# Patient Record
Sex: Male | Born: 1980 | Race: Black or African American | Hispanic: No | Marital: Single | Smoking: Current some day smoker
Health system: Southern US, Community
[De-identification: ages and names within clinical notes are randomized; demographics above are authoritative.]

## PROBLEM LIST (undated history)

## (undated) DIAGNOSIS — J45909 Unspecified asthma, uncomplicated: Secondary | ICD-10-CM

## (undated) DIAGNOSIS — F209 Schizophrenia, unspecified: Secondary | ICD-10-CM

## (undated) HISTORY — PX: NO PAST SURGERIES: SHX2092

---

## 2002-03-15 ENCOUNTER — Emergency Department (HOSPITAL_COMMUNITY): Admission: EM | Admit: 2002-03-15 | Discharge: 2002-03-15 | Payer: Self-pay | Admitting: Emergency Medicine

## 2002-03-15 ENCOUNTER — Encounter: Payer: Self-pay | Admitting: Emergency Medicine

## 2002-04-16 ENCOUNTER — Emergency Department (HOSPITAL_COMMUNITY): Admission: EM | Admit: 2002-04-16 | Discharge: 2002-04-16 | Payer: Self-pay | Admitting: Emergency Medicine

## 2006-04-16 ENCOUNTER — Emergency Department (HOSPITAL_COMMUNITY): Admission: EM | Admit: 2006-04-16 | Discharge: 2006-04-16 | Payer: Self-pay | Admitting: Family Medicine

## 2006-04-20 ENCOUNTER — Emergency Department (HOSPITAL_COMMUNITY): Admission: EM | Admit: 2006-04-20 | Discharge: 2006-04-20 | Payer: Self-pay | Admitting: Emergency Medicine

## 2006-04-27 ENCOUNTER — Emergency Department (HOSPITAL_COMMUNITY): Admission: EM | Admit: 2006-04-27 | Discharge: 2006-04-27 | Payer: Self-pay | Admitting: Emergency Medicine

## 2009-06-09 ENCOUNTER — Emergency Department (HOSPITAL_COMMUNITY): Admission: EM | Admit: 2009-06-09 | Discharge: 2009-06-09 | Payer: Self-pay | Admitting: Family Medicine

## 2010-01-28 ENCOUNTER — Emergency Department (HOSPITAL_COMMUNITY): Admission: EM | Admit: 2010-01-28 | Discharge: 2010-01-28 | Payer: Self-pay | Admitting: Family Medicine

## 2010-10-30 ENCOUNTER — Emergency Department (HOSPITAL_COMMUNITY): Admission: EM | Admit: 2010-10-30 | Discharge: 2010-10-30 | Payer: Self-pay | Admitting: Emergency Medicine

## 2012-01-20 ENCOUNTER — Encounter (HOSPITAL_COMMUNITY): Payer: Self-pay | Admitting: *Deleted

## 2012-01-20 ENCOUNTER — Emergency Department (HOSPITAL_COMMUNITY)
Admission: EM | Admit: 2012-01-20 | Discharge: 2012-01-20 | Disposition: A | Discharge status: Home / Self Care | Payer: Self-pay | Source: Home / Self Care | Attending: Emergency Medicine | Admitting: Emergency Medicine

## 2012-01-20 DIAGNOSIS — L0591 Pilonidal cyst without abscess: Secondary | ICD-10-CM

## 2012-01-20 DIAGNOSIS — L0889 Other specified local infections of the skin and subcutaneous tissue: Secondary | ICD-10-CM

## 2012-01-20 DIAGNOSIS — K130 Diseases of lips: Secondary | ICD-10-CM

## 2012-01-20 DIAGNOSIS — L0592 Pilonidal sinus without abscess: Secondary | ICD-10-CM

## 2012-01-20 MED ORDER — METRONIDAZOLE 500 MG PO TABS: 500.0000 mg | ORAL_TABLET | Freq: Three times a day (TID) | ORAL | Status: AC

## 2012-01-20 MED ORDER — CEPHALEXIN 500 MG PO CAPS: 500.0000 mg | ORAL_CAPSULE | Freq: Three times a day (TID) | ORAL | Status: AC

## 2012-01-20 MED ORDER — NYSTATIN 100000 UNIT/GM EX CREA: TOPICAL_CREAM | Freq: Two times a day (BID) | CUTANEOUS | Status: AC

## 2012-01-20 NOTE — ED Provider Notes (Signed)
Chief Complaint  Patient presents with  . Blister    History of Present Illness:   The patient is in today with 2 separate skin issues. The first is a small sore or crack at the right corner of the mouth. It's been present for about 3 weeks. He denies any intraoral lesions, or other facial skin lesions.  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 It's been present for about 4 months. Drainage is intermittent. It's not painful but it does burn. He's tried some antibiotic and hydrocortisone cream without much relief. He denies any fever or chills.  Review of Systems:  Other than noted above, the patient denies any of the following symptoms: Systemic:  No fever, chills, sweats, weight loss, or fatigue. ENT:  No nasal congestion, rhinorrhea, sore throat, swelling of lips, tongue or throat. Resp:  No cough, wheezing, or shortness of breath. Skin:  No rash, itching, nodules, or suspicious lesions.  PMFSH:  Past medical history, family history, social history, meds, and allergies were reviewed.  Physical Exam:   Vital signs:  BP 152/89  Pulse 88  Temp(Src) 98.2 F (36.8 C) (Oral)  Resp 16  SpO2 100% Gen:  Alert, oriented, in no distress. Skin:  There is a small crack at the right corner of the mouth. No intraoral lesions. No other lesions on the face, no adenopathy. Exam of the perianal skin reveals a draining sinus just posterior to the anus. This was mildly tender to palpation. There was some yellowish drainage. No swelling or fluctuance. He also has what appears to be a nevus or a small skin tag just posterior to this as well. Skin is otherwise clear without any other skin lesions being seen.  Assessment:   Diagnoses that have been ruled out:  None  Diagnoses that are still under consideration:  None  Final diagnoses:  Pilonidal sinus  Perleche    Plan:   1.  The following meds were prescribed:   New Prescriptions   CEPHALEXIN (KEFLEX) 500  MG CAPSULE    Take 1 capsule (500 mg total) by mouth 3 (three) times daily.   METRONIDAZOLE (FLAGYL) 500 MG TABLET    Take 1 tablet (500 mg total) by mouth 3 (three) times daily.   NYSTATIN CREAM (MYCOSTATIN)    Apply topically 2 (two) times daily.   2.  The patient was instructed in symptomatic care and handouts were given. 3.  The patient was told to return if becoming worse in any way, if no better in 3 or 4 days, and given some red flag symptoms that would indicate earlier return.  Follow up:  The patient was told to follow up with Dr. Avel Peace if the sinus had cleared up within 10 days of antibiotic treatment with dual antibiotics.      Roque Lias, MD 01/20/12 [phone]

## 2012-01-20 NOTE — ED Notes (Signed)
Per pt has sore/blister on right upper lip and abscess left buttocks draining sore when sitting

## 2012-01-20 NOTE — Discharge Instructions (Signed)
Pilonidal Cyst A pilonidal cyst occurs when hairs get trapped (ingrown) beneath the skin in the crease between the buttocks over your sacrum (the bone under that crease). Pilonidal cysts are most common in young men with a lot of body hair. When the cyst is ruptured (breaks) or leaking, fluid from the cyst may cause burning and itching. If the cyst becomes infected, it causes a painful swelling filled with pus (abscess). The pus and trapped hairs need to be removed (often by lancing) so that the infection can heal. However, recurrence is common and an operation may be needed to remove the cyst. HOME CARE INSTRUCTIONS   If the cyst was NOT INFECTED:   Keep the area clean and dry. Bathe or shower daily. Wash the area well with a germ-killing soap. Warm tub baths may help prevent infection and help with drainage. Dry the area well with a towel.   Avoid tight clothing to keep area as moisture free as possible.   Keep area between buttocks as free of hair as possible. A depilatory may be used.   If the cyst WAS INFECTED and needed to be drained:   Your caregiver packed the wound with gauze to keep the wound open. This allows the wound to heal from the inside outwards and continue draining.   Return for a wound check in 1 day or as suggested.   If you take tub baths or showers, repack the wound with gauze following them. Sponge baths (at the sink) are a good alternative.   If an antibiotic was ordered to fight the infection, take as directed.   Only take over-the-counter or prescription medicines for pain, discomfort, or fever as directed by your caregiver.   After the drain is removed, use sitz baths for 20 minutes 4 times per day. Clean the wound gently with mild unscented soap, pat dry, and then apply a dry dressing.  SEEK MEDICAL CARE IF:   You have increased pain, swelling, redness, drainage, or bleeding from the area.   You have a fever.   You have muscles aches, dizziness, or a  general ill feeling.  Document Released: 11/17/2000 Document Revised: 08/02/2011 Document Reviewed: 01/15/2009 [employer] Patient Information 2012 [employer]. 

## 2012-08-31 ENCOUNTER — Encounter (HOSPITAL_COMMUNITY): Payer: Self-pay | Admitting: Emergency Medicine

## 2012-08-31 ENCOUNTER — Emergency Department (HOSPITAL_COMMUNITY)
Admission: EM | Admit: 2012-08-31 | Discharge: 2012-08-31 | Disposition: A | Discharge status: Home / Self Care | Payer: Self-pay | Source: Home / Self Care | Attending: Family Medicine | Admitting: Family Medicine

## 2012-08-31 DIAGNOSIS — K089 Disorder of teeth and supporting structures, unspecified: Secondary | ICD-10-CM

## 2012-08-31 DIAGNOSIS — K0889 Other specified disorders of teeth and supporting structures: Secondary | ICD-10-CM

## 2012-08-31 HISTORY — DX: Unspecified asthma, uncomplicated: J45.909

## 2012-08-31 MED ORDER — DICLOFENAC POTASSIUM 50 MG PO TABS: 50.0000 mg | ORAL_TABLET | Freq: Three times a day (TID) | ORAL | Status: DC

## 2012-08-31 MED ORDER — CLINDAMYCIN HCL 150 MG PO CAPS: 150.0000 mg | ORAL_CAPSULE | Freq: Four times a day (QID) | ORAL | Status: DC

## 2012-08-31 NOTE — ED Notes (Signed)
Pt c/o dental pain x1 week... Sx include pain on right side... Denies: fevers, vomiting, nausea, diarrhea, swelling

## 2012-08-31 NOTE — ED Provider Notes (Signed)
History     CSN: [id number]  Arrival date & time 08/31/12  1602   First MD Initiated Contact with Patient 08/31/12 1605      Chief Complaint  Patient presents with  . Dental Pain    (Consider location/radiation/quality/duration/timing/severity/associated sxs/prior treatment) Patient is a 31 y.o. male presenting with tooth pain. The history is provided by the patient.  Dental PainThe primary symptoms include mouth pain. Primary symptoms do not include headaches, fever or sore throat. The symptoms began more than 1 week ago. The symptoms are worsening. The symptoms occur constantly.  Additional symptoms include: dental sensitivity to temperature and jaw pain. Additional symptoms do not include: gum swelling, purulent gums and trouble swallowing. Medical issues include: periodontal disease.    Past Medical History  Diagnosis Date  . Asthma     History reviewed. No pertinent past surgical history.  No family history on file.  History  Substance Use Topics  . Smoking status: Current Every Day Smoker  . Smokeless tobacco: Not on file  . Alcohol Use: Yes      Review of Systems  Constitutional: Negative.  Negative for fever.  HENT: Positive for dental problem. Negative for sore throat and trouble swallowing.   Neurological: Negative for headaches.    Allergies  Review of patient's allergies indicates no known allergies.  Home Medications   Current Outpatient Rx  Name Route Sig Dispense Refill  . CLINDAMYCIN HCL 150 MG PO CAPS Oral Take 1 capsule (150 mg total) by mouth 4 (four) times daily. 28 capsule 0  . DICLOFENAC POTASSIUM 50 MG PO TABS Oral Take 1 tablet (50 mg total) by mouth 3 (three) times daily. 30 tablet 0  . NYSTATIN 100000 UNIT/GM EX CREA Topical Apply topically 2 (two) times daily. 30 g 0    BP 130/89  Pulse 80  Temp 97.9 F (36.6 C) (Oral)  Resp 16  SpO2 100%  Physical Exam  Nursing note and vitals reviewed. Constitutional: He appears  well-developed and well-nourished.  HENT:  Head: Normocephalic.  Right Ear: External ear normal.  Left Ear: External ear normal.  Mouth/Throat: Oropharynx is clear and moist. Dental abscesses and dental caries present.      ED Course  Procedures (including critical care time)  Labs Reviewed - No data to display No results found.   1. Pain, dental       MDM          Linna Hoff, MD 08/31/12 [phone]

## 2013-12-04 DIAGNOSIS — F319 Bipolar disorder, unspecified: Secondary | ICD-10-CM

## 2013-12-04 HISTORY — DX: Bipolar disorder, unspecified: F31.9

## 2014-07-15 ENCOUNTER — Emergency Department (HOSPITAL_COMMUNITY)
Admission: EM | Admit: 2014-07-15 | Discharge: 2014-07-15 | Disposition: A | Discharge status: Home / Self Care | Payer: Self-pay | Attending: Emergency Medicine | Admitting: Emergency Medicine

## 2014-07-15 ENCOUNTER — Encounter (HOSPITAL_COMMUNITY): Payer: Self-pay | Admitting: Emergency Medicine

## 2014-07-15 DIAGNOSIS — J45909 Unspecified asthma, uncomplicated: Secondary | ICD-10-CM | POA: Insufficient documentation

## 2014-07-15 DIAGNOSIS — S0510XA Contusion of eyeball and orbital tissues, unspecified eye, initial encounter: Secondary | ICD-10-CM | POA: Insufficient documentation

## 2014-07-15 DIAGNOSIS — IMO0002 Reserved for concepts with insufficient information to code with codable children: Secondary | ICD-10-CM | POA: Insufficient documentation

## 2014-07-15 DIAGNOSIS — Y9289 Other specified places as the place of occurrence of the external cause: Secondary | ICD-10-CM | POA: Insufficient documentation

## 2014-07-15 DIAGNOSIS — S0592XA Unspecified injury of left eye and orbit, initial encounter: Secondary | ICD-10-CM

## 2014-07-15 DIAGNOSIS — Y9389 Activity, other specified: Secondary | ICD-10-CM | POA: Insufficient documentation

## 2014-07-15 DIAGNOSIS — F17200 Nicotine dependence, unspecified, uncomplicated: Secondary | ICD-10-CM | POA: Insufficient documentation

## 2014-07-15 MED ORDER — FLUORESCEIN-BENOXINATE 0.25-0.4 % OP SOLN
1.0000 [drp] | Freq: Once | OPHTHALMIC | Status: DC
  Filled 2014-07-15: qty 5

## 2014-07-15 MED ORDER — FLUORESCEIN SODIUM 1 MG OP STRP
1.0000 | ORAL_STRIP | Freq: Once | OPHTHALMIC | Status: AC
  Administered 2014-07-15: 1 via OPHTHALMIC
  Filled 2014-07-15: qty 1

## 2014-07-15 MED ORDER — TETRACAINE HCL 0.5 % OP SOLN
1.0000 [drp] | Freq: Once | OPHTHALMIC | Status: AC
  Administered 2014-07-15: 1 [drp] via OPHTHALMIC
  Filled 2014-07-15: qty 2

## 2014-07-15 MED ORDER — POLYMYXIN B-TRIMETHOPRIM 10000-0.1 UNIT/ML-% OP SOLN: 1.0000 [drp] | OPHTHALMIC | Status: DC

## 2014-07-15 NOTE — ED Notes (Signed)
Offered motrin for pain, but patient declined.  Discussed eye exam and drops that will assist with pain relief when preformed by PA. No other concerns from patient at this time.

## 2014-07-15 NOTE — Discharge Instructions (Signed)
Use polytrim drops as directed until symptoms resolve.

## 2014-07-15 NOTE — ED Notes (Signed)
woodslamp at the bedside.

## 2014-07-15 NOTE — ED Notes (Signed)
Called main pharmacy in regards to missing medication, flurate.  It is being sent.

## 2014-07-15 NOTE — ED Notes (Signed)
Pt report he cardboard poster fell off his dresser and hit him in the Left eye. Eye is red and inflamed. Pt denies visual disturbances. Ax4, NAD. Pt complains his keeps watering.

## 2014-07-15 NOTE — ED Provider Notes (Signed)
CSN: [id number]     Arrival date & time 07/15/14  2005 History   First MD Initiated Contact with Patient 07/15/14 2038   This chart was scribed for non-physician practitioner Paulita CradleKaitlin Maggie Dworkin, PA-C, working with Nelia Shiobert L Beaton, MD by Gwenevere AbbotAlexis Brown, ED scribe. This patient was seen in room TR04C/TR04C and the patient's care was started at 10:27 PM.    Chief Complaint  Patient presents with  . Eye Injury    Patient is a 33 y.o. male presenting with eye injury. The history is provided by the patient. No language interpreter was used.  Eye Injury This is a new problem. The current episode started yesterday. The problem occurs constantly. The problem has not changed since onset.Nothing aggravates the symptoms. Nothing relieves the symptoms.   HPI Comments:  Seth Kim is a 33 y.o. male who presents to the Emergency Department complaining of left eye irritation after he was cleaning last night, and a cardboard poster hit him in the eye. Pt attempted to use visine without relief. Pt denies use of OTC medication.   Past Medical History  Diagnosis Date  . Asthma    History reviewed. No pertinent past surgical history. History reviewed. No pertinent family history. History  Substance Use Topics  . Smoking status: Current Every Day Smoker  . Smokeless tobacco: Not on file  . Alcohol Use: Yes    Review of Systems  Eyes: Positive for pain.  All other systems reviewed and are negative.     Allergies  Review of patient's allergies indicates no known allergies.  Home Medications   Prior to Admission medications   Medication Sig Start Date End Date Taking? Authorizing Provider  Naphazoline HCl (CLEAR EYES OP) Place 1 drop into both eyes daily as needed. For infected eyes   Yes Historical Provider, MD   BP 143/93  Pulse 66  Temp(Src) 97.7 F (36.5 C)  Resp 17  SpO2 100% Physical Exam  Nursing note and vitals reviewed. Constitutional: He is oriented to person, place, and time.  He appears well-developed and well-nourished.  HENT:  Head: Normocephalic and atraumatic.  Eyes: EOM are normal. Pupils are equal, round, and reactive to light.  Conjunctival injection of the left eye. No corneal abrasion noted.   Neck: Normal range of motion. Neck supple.  Cardiovascular: Normal rate.   Pulmonary/Chest: Effort normal.  Musculoskeletal: Normal range of motion.  Neurological: He is alert and oriented to person, place, and time.  Skin: Skin is warm and dry.  Psychiatric: He has a normal mood and affect. His behavior is normal.    ED Course  Procedures  DIAGNOSTIC STUDIES: Oxygen Saturation is 100% on RA, normal by my interpretation.  COORDINATION OF CARE: 10:34 PM-Discussed treatment plan with pt at bedside and pt agreed to plan.  Labs Review Labs Reviewed - No data to display  Imaging Review No results found.   EKG Interpretation None      MDM   Final diagnoses:  Left eye injury, initial encounter    Patient appears to have left eye irritation. No corneal abrasion noted on fluorescin dye exam.   I personally performed the services described in this documentation, which was scribed in my presence. The recorded information has been reviewed and is accurate.      Emilia BeckKaitlyn Analisa Sledd, PA-C 07/16/14 0120

## 2014-07-20 NOTE — ED Provider Notes (Signed)
Medical screening examination/treatment/procedure(s) were performed by non-physician practitioner and as supervising physician I was immediately available for consultation/collaboration.    Crescent Gotham L Isayah Ignasiak, MD 07/20/14 0737 

## 2015-01-21 ENCOUNTER — Encounter (HOSPITAL_COMMUNITY): Payer: Self-pay | Admitting: *Deleted

## 2015-01-21 ENCOUNTER — Emergency Department (HOSPITAL_COMMUNITY)
Admission: EM | Admit: 2015-01-21 | Discharge: 2015-01-21 | Disposition: A | Discharge status: Home / Self Care | Payer: Self-pay | Attending: Emergency Medicine | Admitting: Emergency Medicine

## 2015-01-21 DIAGNOSIS — Z720 Tobacco use: Secondary | ICD-10-CM | POA: Insufficient documentation

## 2015-01-21 DIAGNOSIS — F1910 Other psychoactive substance abuse, uncomplicated: Secondary | ICD-10-CM

## 2015-01-21 DIAGNOSIS — F1010 Alcohol abuse, uncomplicated: Secondary | ICD-10-CM | POA: Insufficient documentation

## 2015-01-21 DIAGNOSIS — Z880 Allergy status to penicillin: Secondary | ICD-10-CM | POA: Insufficient documentation

## 2015-01-21 DIAGNOSIS — F1210 Cannabis abuse, uncomplicated: Secondary | ICD-10-CM | POA: Insufficient documentation

## 2015-01-21 DIAGNOSIS — J45909 Unspecified asthma, uncomplicated: Secondary | ICD-10-CM | POA: Insufficient documentation

## 2015-01-21 DIAGNOSIS — F1410 Cocaine abuse, uncomplicated: Secondary | ICD-10-CM | POA: Insufficient documentation

## 2015-01-21 HISTORY — DX: Schizophrenia, unspecified: F20.9

## 2015-01-21 LAB — COMPREHENSIVE METABOLIC PANEL
ALT: 19 U/L (ref 0–53)
AST: 23 U/L (ref 0–37)
Albumin: 3.7 g/dL (ref 3.5–5.2)
Alkaline Phosphatase: 109 U/L (ref 39–117)
Anion gap: 6 (ref 5–15)
BUN: 9 mg/dL (ref 6–23)
CHLORIDE: 105 mmol/L (ref 96–112)
CO2: 30 mmol/L (ref 19–32)
Calcium: 9 mg/dL (ref 8.4–10.5)
Creatinine, Ser: 1.3 mg/dL (ref 0.50–1.35)
GFR calc Af Amer: 81 mL/min — ABNORMAL LOW (ref >90)
GFR calc non Af Amer: 70 mL/min — ABNORMAL LOW (ref >90)
Glucose, Bld: 123 mg/dL — ABNORMAL HIGH (ref 70–99)
POTASSIUM: 3.6 mmol/L (ref 3.5–5.1)
Sodium: 141 mmol/L (ref 135–145)
Total Bilirubin: 0.6 mg/dL (ref 0.3–1.2)
Total Protein: 6.6 g/dL (ref 6.0–8.3)

## 2015-01-21 LAB — RAPID URINE DRUG SCREEN, HOSP PERFORMED
Amphetamines: NOT DETECTED
BARBITURATES: NOT DETECTED
Benzodiazepines: NOT DETECTED
Cocaine: POSITIVE — AB
Opiates: NOT DETECTED
TETRAHYDROCANNABINOL: POSITIVE — AB

## 2015-01-21 LAB — CBC
HCT: 44 % (ref 39.0–52.0)
Hemoglobin: 15.1 g/dL (ref 13.0–17.0)
MCH: 31.7 pg (ref 26.0–34.0)
MCHC: 34.3 g/dL (ref 30.0–36.0)
MCV: 92.2 fL (ref 78.0–100.0)
Platelets: 246 10*3/uL (ref 150–400)
RBC: 4.77 MIL/uL (ref 4.22–5.81)
RDW: 12.6 % (ref 11.5–15.5)
WBC: 6.3 10*3/uL (ref 4.0–10.5)

## 2015-01-21 LAB — ACETAMINOPHEN LEVEL: Acetaminophen (Tylenol), Serum: <10 ug/mL — ABNORMAL LOW (ref 10–30)

## 2015-01-21 LAB — ETHANOL

## 2015-01-21 LAB — SALICYLATE LEVEL

## 2015-01-21 MED ORDER — LORAZEPAM 1 MG PO TABS: 0.0000 mg | ORAL_TABLET | Freq: Two times a day (BID) | ORAL | Status: DC

## 2015-01-21 MED ORDER — ACETAMINOPHEN 325 MG PO TABS: 650.0000 mg | ORAL_TABLET | ORAL | Status: DC | PRN

## 2015-01-21 MED ORDER — IBUPROFEN 400 MG PO TABS: 600.0000 mg | ORAL_TABLET | Freq: Three times a day (TID) | ORAL | Status: DC | PRN

## 2015-01-21 MED ORDER — LORAZEPAM 1 MG PO TABS: 0.0000 mg | ORAL_TABLET | Freq: Four times a day (QID) | ORAL | Status: DC

## 2015-01-21 MED ORDER — NICOTINE 21 MG/24HR TD PT24: 21.0000 mg | MEDICATED_PATCH | Freq: Every day | TRANSDERMAL | Status: DC

## 2015-01-21 MED ORDER — ONDANSETRON HCL 4 MG PO TABS: 4.0000 mg | ORAL_TABLET | Freq: Three times a day (TID) | ORAL | Status: DC | PRN

## 2015-01-21 NOTE — ED Notes (Signed)
Tele psych set up.  

## 2015-01-21 NOTE — ED Notes (Signed)
MD at bedside. 

## 2015-01-21 NOTE — ED Provider Notes (Signed)
Pt cleared by Psych.  Pt family agreeable to taking pt home.   Pt is stable vitals stable.  Pt advised to follow up out pt for treatment  Elson AreasLeslie K Sofia, PA-C 01/21/15 1545  Arby BarretteMarcy Pfeiffer, MD 01/21/15 2045

## 2015-01-21 NOTE — ED Notes (Signed)
Patient inquires about breakfast and apologizes for yelling previously. Informed that breakfast will be delivered soon, and that medication was available to help symptoms. Patient still declines at this time.

## 2015-01-21 NOTE — [facility] Counselor (Signed)
If pt is d/c'd, mother wants to be contacted before he's d/c'd to avoid possible aggressive behavior when he returns home.  Work [phone], Home [phone]

## 2015-01-21 NOTE — Discharge Instructions (Signed)
°Emergency Department Resource Guide °1) Find a Doctor and Pay Out of Pocket °Although you won't have to find out who is covered by your insurance plan, it is a good idea to ask around and get recommendations. You will then need to call the office and see if the doctor you have chosen will accept you as a new patient and what types of options they offer for patients who are self-pay. Some doctors offer discounts or will set up payment plans for their patients who do not have insurance, but you will need to ask so you aren't surprised when you get to your appointment. ° °2) Contact Your Local Health Department °Not all health departments have doctors that can see patients for sick visits, but many do, so it is worth a call to see if yours does. If you don't know where your local health department is, you can check in your phone book. The CDC also has a tool to help you locate your state's health department, and many state websites also have listings of all of their local health departments. ° °3) Find a Walk-in Clinic °If your illness is not likely to be very severe or complicated, you may want to try a walk in clinic. These are popping up all over the country in pharmacies, drugstores, and shopping centers. They're usually staffed by nurse practitioners or physician assistants that have been trained to treat common illnesses and complaints. They're usually fairly quick and inexpensive. However, if you have serious medical issues or chronic medical problems, these are probably not your best option. ° °No Primary Care Doctor: °- Call [facility] at  832-8000 - they can help you locate a primary care doctor that  accepts your insurance, provides certain services, etc. °- Physician Referral Service- 1-800-533-3463 ° °Chronic Pain Problems: °Organization         Address  Phone   Notes  °[facility]  (336) 297-2271 Patients need to be referred by their primary care doctor.  ° °Medication  Assistance: °Organization         Address  Phone   Notes  °[place] [facility] [address] °[place], [place] [address] (336) 641-8030 --Must be a resident of [place] °-- Must have NO insurance coverage whatsoever (no Medicaid/ Medicare, etc.) °-- The pt. MUST have a primary care doctor that directs their care regularly and follows them in the community °  °[facility]  (866) 331-1348   °[employer]  (888) 892-1162   ° °Agencies that provide inexpensive medical care: °Organization         Address  Phone   Notes  °[facility]  (336) 832-8035   °[facility]    (336) 832-7272   °[facility] [address] °[place], [place] [address] (336) 832-4777   °[facility] [address], °[place] (336) 271-4999   °[facility]    (336) 373-0678   °[facility]    (336) 272-1050   °[facility] ° [address], [place] Phone:  (336) 832-4444, Fax:  (336) 832-4440 Hours of Operation:  9 am - 6 pm, M-F.  Also accepts Medicaid/Medicare and self-pay.  °[facility] ° [address], [place] Phone: (336) 832-3150, Fax: (336) 832-3151. Hours of Operation:  8:30 am - 5:30 pm, M-F.  Also accepts Medicaid and self-pay.  °[facility] [place] [address], [place] Phone: (336) 878-6027   °[facility] [address], [place], [place] (336)723-1848, Ext. 123 Mondays & Thursdays: 7-9 AM.  First 15 patients are seen on a first come, first serve basis. °  ° °Medicaid-accepting [place] Providers: ° °Organization         Address  Phone   Notes  °[facility] [address], [place] (336) 641-2100 Also accepts self-pay patients.  °[facility] [address], [place] ° (336) 856-9996   °[facility] [address], [place]  (336) 288-8857   °[facility] [address] [place] [address], [place] (336) 299-7000   °Veita Bland [address], [place]  ° (336) 373-1557 Only accepts [place] Access Medicaid patients after they have their name applied to their card.  ° °Self-Pay (no insurance) in [place]: ° °Organization         Address  Phone   Notes  °Sickle Cell Patients, [facility] [address], [place] (336) 832-1970   °[facility] [address], [place] (336) 832-4400   °[facility] ° [address], [place] (336) 992-4800   °[facility]/Dr. Osei-Bonsu ° [address] [place] [address], [place] or [address], [place] (336) 841-8500 Phone number for both [place] and [place] locations is the same.  °[facility] [address], [place] (336) 299-0000   °[facility] [address] [place] [address], [place] or [address] (336) 852-7530 °(336) 878-2260   °[facility] [address], [place] (336) 350-1642, phone; (336) 294-5005, fax Sees patients 1st and 3rd Saturday of every month.  Must not qualify for public or private insurance (i.e. Medicaid, Medicare, [place] Health Choice, Veterans' Benefits) • Household income should be no more than 200% of the poverty level •The clinic cannot treat you if you are pregnant or think you are pregnant • Sexually transmitted diseases are not treated at the clinic.  ° ° °Dental Care: °Organization         Address  Phone  Notes  °[place] [facility] [address], [place] (336) 641-6152 Accepts children up to age 21 who are enrolled in Medicaid or [place] Health Choice; pregnant women with a Medicaid card; and children who have applied for Medicaid or [place] Health Choice, but were declined, whose parents can pay a reduced fee at time of service.  °[place]  [facility] [place]  [address], [place] (336) 641-7733 Accepts children up to age 21 who are enrolled in Medicaid or [place] Health Choice; pregnant women with a Medicaid card; and children who have applied for Medicaid or  Health Choice, but were declined, whose parents can pay a reduced fee at time of service.  °[facility] ° [address], [place] (336) 641-4533 Patients are seen by appointment only. Walk-ins are not accepted. [facility] will see patients 18 years of age and older. °Monday - Tuesday (8am-5pm) °Most Wednesdays (8:30-5pm) °$30 per visit, cash only  °[facility] ° [address], [place] (336) 641-4533 Patients are seen by appointment only. Walk-ins are not accepted. [facility] will see patients 18 years of age and older. °One   Wednesday Evening (Monthly: Volunteer Based).  $30 per visit, cash only  °[facility]  (919) 537-3737 for adults; Children under age 4, call Graduate Pediatric Dentistry at (919) [phone]. Children aged 4-14, please call (919) [phone] to request a pediatric application. ° Dental services are provided in all areas of dental care including fillings, crowns and bridges, complete and partial dentures, implants, gum treatment, root canals, and extractions. Preventive care is also provided. Treatment is provided to both adults and children. °Patients are selected via a lottery and there is often a waiting list. °  °[facility] [address], °[place] ° (336) 763-8833 [email or web address] °  °[facility] [address], [place], [place] (336)723-1848, Ext. 123 Second and Fourth Thursday of each month, opens at 6:30 AM; Clinic ends at 9 AM.  Patients are seen on a first-come first-served basis, and a limited number are seen during each clinic.  ° °[facility] ° [address], [place], [place] (336) 723-7904    Eligibility Requirements °You must have lived in [place], [place], or [place] counties for at least the last three months. °  You cannot be eligible for state or federal sponsored healthcare insurance, including Veterans Administration, Medicaid, or Medicare. °  You generally cannot be eligible for healthcare insurance through your employer.  °  How to apply: °Eligibility screenings are held every Tuesday and Wednesday afternoon from 1:00 pm until 4:00 pm. You do not need an appointment for the interview!  °[facility] [address], [place], [place] 336-631-2330   °[facility]  336-342-8273   °[facility]  336-703-3100   °[facility]  336-570-6415   ° °Behavioral Health Resources in the Community: °Intensive Outpatient Programs °Organization         Address  Phone  Notes  °[facility] [address], [place], [place] 336-878-6098   °[facility] [address], [place], [place] 336-832-9800   °[facility] [address], [place], [place] ° 336-882-2125   °[facility] [address],  °[place], [place] 1-800-853-5163 or 336-641-4981   °Substance Abuse Resources °Organization         Address  Phone  Notes  °[facility]  336-882-2125   °[facility]  336-784-9470   °[facility]  336-285-9073   °[facility]  336-845-3988   °Residential & Outpatient Substance Abuse Program  1-800-659-3381   °Psychological Services °Organization         Address  Phone  Notes  °[facility]  336- 832-9600   °[facility]  336- 378-7881   °[facility] [address], [place] 1-800-853-5163 or 336-641-4981   ° °Mobile Crisis Teams °Organization         Address  Phone  Notes  °[facility], Mobile Crisis Care Unit  1-877-626-1772   °Assertive °[facility] ° [address]  [place], [place] 336-834-9664   °Sharon DeEsch [address] °[place] 336-554-5454   ° °Self-Help/Support Groups °Organization         Address  Phone             Notes  °[facility] [place] - variety of support groups  336- 373-1402 Call for more information  °Narcotics Anonymous (NA), [facility] [address], °[place]  2 meetings at this location  ° °  Residential Treatment Programs °Organization         Address  Phone  Notes  °[facility] [address],    °[place]  1-866-801-8205   °[facility] ° [address], [place], [place] 704-293-8524   °[facility] [address], [place] 336-845-3988 Admissions: 8am-3pm M-F  °[facility] [address]    °[place], [place] 336-841-1104   °[facility] [address], [place], [place] 336-379-7146   °[facility] [address]  °[place], [place] 336-285-9073   °[facility] - Intensive Outpatient [address], [place], [place] 336-852-3033   °[facility] [address]  °[place], [place] 1-877-615-2722 or 336-784-9470   °[facility] [address] [place], [place] 336-227-7417 Accepts Medicaid  °[facility] [address]  °[place] 1-800-659-3381 Substance Abuse/Addiction Treatment  ° °[place] Behavioral Health Resources °Organization         Address  Phone  Notes  °[facility]  (888) 581-9988   °Julie Brannon, PhD [address] [place], [place]   (336) 349-5553 or (336) 951-0000   °[facility]   [address] °[place], [place] (336) 349-4454   °[facility] [address], [place], [place] (336) 342-8316 Insurance/Medicaid/sponsorship through [facility]  °[facility] [address]                                    [place], [place] (336) 342-8316 Therapy/tele-psych/case    °[facility] [address]  ° [place], [place] (336) 349-2233    °Dr. Arfeen  (336) 349-4544   °[facility] [place]  [employer] [place] [facility] 1) [address], [place] °2) [address], [place] °3)  [address], [place] (336) 349-3220 °(336) 342-7768 ° °(336) 342-8140   °[place] [facility] (336) 342-1394 or (336) 342-3537 (After Hours)    ° ° °
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
## 2015-01-21 NOTE — ED Notes (Signed)
Attempted to conduct CIWA score, patient initially refuses to answer. Then states, "what the fuck you asking all these questions for, I aint no crack head, no coke head. Get the AGCO Corporationfuck outta hear with that shit.". Continued to explain it may help with calming nerves, and he replies, "do i look fucking nervous to you. Get the fuck on". Obtained vitals, and left patient sleeping.

## 2015-01-21 NOTE — ED Notes (Signed)
Mother not currently present at the bedside, but wanted counselor to know that "he doesn't have psychiatric problems, he ain't crazy, it's just those drugs he's on". Informed Terri with behavioral prior to setting up telepsych.
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
## 2015-01-21 NOTE — ED Notes (Signed)
The pt  Has started saying that someone was out to get the pt and his mother. Alcohol and drug use.  Last drug use was today cocaine and alcohol
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
## 2015-01-21 NOTE — ED Notes (Signed)
BH HAS CALLED AND DID NOT HAVE SUCCESS IN TALKING TO PT ON TELEPSYCH. PT HEARD YELLING PROFANITY AT THE SCREEN. THEY HAVE CALLED AND ASKED US TO TALK TO THE PATIENT ABOUT OFFERING HIM DRUG DETOX/REHAB. PATIENT CALM AND PLEASANT WITH STAFF. STATES HE DOES NOT NEED ANY DRUG TREATMENT AND FEELS LIKE HE IS FINE TO RETURN HOME. BH MADE AWARE OF PT DECISION. THEY WILL CALL HIS MOTHER AND MAKE HER AWARE.

## 2015-01-21 NOTE — ED Notes (Signed)
SPOKE TO BH. THEY ADVISE THEY ARE TRYING TO CONTACT PT MOTHER BUT HAVE NOT AS YET. PT MOTHER EVIDENTLY REQUESTED TO HAVE ADVANCE NOTICE OF HIS DISCHARGE

## 2015-01-21 NOTE — ED Notes (Signed)
Patient uncooperative at this time when attempting to conduct telepsych.

## 2015-01-22 ENCOUNTER — Inpatient Hospital Stay (HOSPITAL_COMMUNITY)
Admission: EM | Admit: 2015-01-22 | Discharge: 2015-01-28 | DRG: 885 | Disposition: A | Discharge status: Home / Self Care | Payer: Self-pay | Attending: Family Medicine | Admitting: Family Medicine

## 2015-01-22 ENCOUNTER — Emergency Department (HOSPITAL_COMMUNITY): Payer: Self-pay

## 2015-01-22 ENCOUNTER — Encounter (HOSPITAL_COMMUNITY): Payer: Self-pay | Admitting: Emergency Medicine

## 2015-01-22 DIAGNOSIS — R451 Restlessness and agitation: Secondary | ICD-10-CM | POA: Diagnosis present

## 2015-01-22 DIAGNOSIS — R748 Abnormal levels of other serum enzymes: Secondary | ICD-10-CM

## 2015-01-22 DIAGNOSIS — E872 Acidosis, unspecified: Secondary | ICD-10-CM

## 2015-01-22 DIAGNOSIS — S51811A Laceration without foreign body of right forearm, initial encounter: Secondary | ICD-10-CM | POA: Diagnosis present

## 2015-01-22 DIAGNOSIS — R410 Disorientation, unspecified: Secondary | ICD-10-CM

## 2015-01-22 DIAGNOSIS — R4182 Altered mental status, unspecified: Secondary | ICD-10-CM | POA: Diagnosis present

## 2015-01-22 DIAGNOSIS — W19XXXA Unspecified fall, initial encounter: Secondary | ICD-10-CM | POA: Diagnosis present

## 2015-01-22 DIAGNOSIS — N179 Acute kidney failure, unspecified: Secondary | ICD-10-CM

## 2015-01-22 DIAGNOSIS — F17210 Nicotine dependence, cigarettes, uncomplicated: Secondary | ICD-10-CM | POA: Diagnosis present

## 2015-01-22 DIAGNOSIS — R740 Nonspecific elevation of levels of transaminase and lactic acid dehydrogenase [LDH]: Secondary | ICD-10-CM | POA: Diagnosis present

## 2015-01-22 DIAGNOSIS — D72829 Elevated white blood cell count, unspecified: Secondary | ICD-10-CM

## 2015-01-22 DIAGNOSIS — R52 Pain, unspecified: Secondary | ICD-10-CM

## 2015-01-22 DIAGNOSIS — R440 Auditory hallucinations: Secondary | ICD-10-CM | POA: Diagnosis present

## 2015-01-22 DIAGNOSIS — Z9119 Patient's noncompliance with other medical treatment and regimen: Secondary | ICD-10-CM | POA: Diagnosis present

## 2015-01-22 DIAGNOSIS — F209 Schizophrenia, unspecified: Secondary | ICD-10-CM | POA: Diagnosis present

## 2015-01-22 DIAGNOSIS — R7401 Elevation of levels of liver transaminase levels: Secondary | ICD-10-CM | POA: Insufficient documentation

## 2015-01-22 DIAGNOSIS — J45909 Unspecified asthma, uncomplicated: Secondary | ICD-10-CM | POA: Diagnosis present

## 2015-01-22 DIAGNOSIS — F39 Unspecified mood [affective] disorder: Secondary | ICD-10-CM | POA: Diagnosis present

## 2015-01-22 DIAGNOSIS — S61411A Laceration without foreign body of right hand, initial encounter: Secondary | ICD-10-CM | POA: Diagnosis present

## 2015-01-22 DIAGNOSIS — R4587 Impulsiveness: Secondary | ICD-10-CM | POA: Diagnosis present

## 2015-01-22 DIAGNOSIS — E876 Hypokalemia: Secondary | ICD-10-CM | POA: Diagnosis present

## 2015-01-22 DIAGNOSIS — F22 Delusional disorders: Principal | ICD-10-CM | POA: Diagnosis present

## 2015-01-22 LAB — COMPREHENSIVE METABOLIC PANEL
ALT: 29 U/L (ref 0–53)
ANION GAP: 24 — AB (ref 5–15)
AST: 83 U/L — ABNORMAL HIGH (ref 0–37)
Albumin: 4.3 g/dL (ref 3.5–5.2)
Alkaline Phosphatase: 107 U/L (ref 39–117)
BUN: 12 mg/dL (ref 6–23)
CO2: 13 mmol/L — ABNORMAL LOW (ref 19–32)
Calcium: 9.4 mg/dL (ref 8.4–10.5)
Chloride: 104 mmol/L (ref 96–112)
Creatinine, Ser: 2.35 mg/dL — ABNORMAL HIGH (ref 0.50–1.35)
GFR, EST AFRICAN AMERICAN: 40 mL/min — AB (ref >90)
GFR, EST NON AFRICAN AMERICAN: 34 mL/min — AB (ref >90)
GLUCOSE: 130 mg/dL — AB (ref 70–99)
Potassium: 4.2 mmol/L (ref 3.5–5.1)
Sodium: 141 mmol/L (ref 135–145)
TOTAL PROTEIN: 7.6 g/dL (ref 6.0–8.3)
Total Bilirubin: 1 mg/dL (ref 0.3–1.2)

## 2015-01-22 LAB — CBC WITH DIFFERENTIAL/PLATELET
BASOS ABS: 0 10*3/uL (ref 0.0–0.1)
Basophils Relative: 0 % (ref 0–1)
EOS PCT: 0 % (ref 0–5)
Eosinophils Absolute: 0 10*3/uL (ref 0.0–0.7)
HCT: 47.3 % (ref 39.0–52.0)
Hemoglobin: 16.1 g/dL (ref 13.0–17.0)
LYMPHS PCT: 7 % — AB (ref 12–46)
Lymphs Abs: 1.1 10*3/uL (ref 0.7–4.0)
MCH: 31.6 pg (ref 26.0–34.0)
MCHC: 34 g/dL (ref 30.0–36.0)
MCV: 92.9 fL (ref 78.0–100.0)
MONO ABS: 1.2 10*3/uL — AB (ref 0.1–1.0)
Monocytes Relative: 8 % (ref 3–12)
Neutro Abs: 12.7 10*3/uL — ABNORMAL HIGH (ref 1.7–7.7)
Neutrophils Relative %: 85 % — ABNORMAL HIGH (ref 43–77)
Platelets: 240 10*3/uL (ref 150–400)
RBC: 5.09 MIL/uL (ref 4.22–5.81)
RDW: 12.7 % (ref 11.5–15.5)
WBC: 15 10*3/uL — AB (ref 4.0–10.5)

## 2015-01-22 LAB — ETHANOL: Alcohol, Ethyl (B): <5 mg/dL (ref 0–9)

## 2015-01-22 LAB — AMMONIA: Ammonia: 211 umol/L — ABNORMAL HIGH (ref 11–32)

## 2015-01-22 LAB — I-STAT CG4 LACTIC ACID, ED
Lactic Acid, Venous: 14.94 mmol/L (ref 0.5–2.0)
Lactic Acid, Venous: 2.95 mmol/L (ref 0.5–2.0)

## 2015-01-22 LAB — ACETAMINOPHEN LEVEL

## 2015-01-22 LAB — SALICYLATE LEVEL

## 2015-01-22 LAB — TSH: TSH: 2.981 u[IU]/mL (ref 0.350–4.500)

## 2015-01-22 MED ORDER — SODIUM CHLORIDE 0.9 % IV BOLUS (SEPSIS)
1000.0000 mL | Freq: Once | INTRAVENOUS | Status: AC
  Administered 2015-01-22: 1000 mL via INTRAVENOUS

## 2015-01-22 MED ORDER — TETANUS-DIPHTH-ACELL PERTUSSIS 5-2.5-18.5 LF-MCG/0.5 IM SUSP
INTRAMUSCULAR | Status: AC
  Filled 2015-01-22: qty 0.5

## 2015-01-22 MED ORDER — VANCOMYCIN HCL 10 G IV SOLR
1500.0000 mg | Freq: Once | INTRAVENOUS | Status: AC
  Administered 2015-01-23: 1500 mg via INTRAVENOUS
  Filled 2015-01-22: qty 1500

## 2015-01-22 MED ORDER — DEXTROSE 5 % IV SOLN
2.0000 g | INTRAVENOUS | Status: DC
  Administered 2015-01-23: 2 g via INTRAVENOUS
  Filled 2015-01-22: qty 2

## 2015-01-22 MED ORDER — LIDOCAINE-EPINEPHRINE 1 %-1:100000 IJ SOLN
10.0000 mL | Freq: Once | INTRAMUSCULAR | Status: DC
  Filled 2015-01-22: qty 1

## 2015-01-22 MED ORDER — TETANUS-DIPHTH-ACELL PERTUSSIS 5-2.5-18.5 LF-MCG/0.5 IM SUSP
0.5000 mL | Freq: Once | INTRAMUSCULAR | Status: AC
  Administered 2015-01-22: 0.5 mL via INTRAMUSCULAR

## 2015-01-22 MED ORDER — TETANUS-DIPHTHERIA TOXOIDS TD 5-2 LFU IM INJ: 0.5000 mL | INJECTION | Freq: Once | INTRAMUSCULAR | Status: DC
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
## 2015-01-22 NOTE — ED Notes (Signed)
Per [employer] officers, patient is currently in police custody and must remain restrained per their protocol. Currently right wrist and left wrist are handcuffed to bed rails. Circulation intact, color normal, sensation intact bilaterally.
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
## 2015-01-23 ENCOUNTER — Encounter (HOSPITAL_COMMUNITY): Payer: Self-pay | Admitting: Emergency Medicine

## 2015-01-23 ENCOUNTER — Inpatient Hospital Stay (HOSPITAL_COMMUNITY): Payer: Self-pay

## 2015-01-23 DIAGNOSIS — N179 Acute kidney failure, unspecified: Secondary | ICD-10-CM

## 2015-01-23 DIAGNOSIS — F22 Delusional disorders: Secondary | ICD-10-CM | POA: Diagnosis present

## 2015-01-23 DIAGNOSIS — R4182 Altered mental status, unspecified: Secondary | ICD-10-CM | POA: Diagnosis present

## 2015-01-23 DIAGNOSIS — F200 Paranoid schizophrenia: Secondary | ICD-10-CM

## 2015-01-23 DIAGNOSIS — E872 Acidosis: Secondary | ICD-10-CM

## 2015-01-23 LAB — COMPREHENSIVE METABOLIC PANEL
ALBUMIN: 3.1 g/dL — AB (ref 3.5–5.2)
ALK PHOS: 76 U/L (ref 39–117)
ALT: 30 U/L (ref 0–53)
ANION GAP: 7 (ref 5–15)
AST: 92 U/L — ABNORMAL HIGH (ref 0–37)
BILIRUBIN TOTAL: 1 mg/dL (ref 0.3–1.2)
BUN: 7 mg/dL (ref 6–23)
CO2: 22 mmol/L (ref 19–32)
Calcium: 8.2 mg/dL — ABNORMAL LOW (ref 8.4–10.5)
Chloride: 113 mmol/L — ABNORMAL HIGH (ref 96–112)
Creatinine, Ser: 1.51 mg/dL — ABNORMAL HIGH (ref 0.50–1.35)
GFR calc Af Amer: 68 mL/min — ABNORMAL LOW (ref >90)
GFR calc non Af Amer: 58 mL/min — ABNORMAL LOW (ref >90)
Glucose, Bld: 77 mg/dL (ref 70–99)
Potassium: 3.4 mmol/L — ABNORMAL LOW (ref 3.5–5.1)
Sodium: 142 mmol/L (ref 135–145)
Total Protein: 6 g/dL (ref 6.0–8.3)

## 2015-01-23 LAB — URINALYSIS, ROUTINE W REFLEX MICROSCOPIC
Bilirubin Urine: NEGATIVE
GLUCOSE, UA: NEGATIVE mg/dL
Ketones, ur: 15 mg/dL — AB
Leukocytes, UA: NEGATIVE
NITRITE: NEGATIVE
PROTEIN: 100 mg/dL — AB
Specific Gravity, Urine: 1.01 (ref 1.005–1.030)
Urobilinogen, UA: 0.2 mg/dL (ref 0.0–1.0)
pH: 5.5 (ref 5.0–8.0)

## 2015-01-23 LAB — CBC
HEMATOCRIT: 38.8 % — AB (ref 39.0–52.0)
HEMOGLOBIN: 13.4 g/dL (ref 13.0–17.0)
MCH: 31.5 pg (ref 26.0–34.0)
MCHC: 34.5 g/dL (ref 30.0–36.0)
MCV: 91.1 fL (ref 78.0–100.0)
Platelets: 226 10*3/uL (ref 150–400)
RBC: 4.26 MIL/uL (ref 4.22–5.81)
RDW: 13 % (ref 11.5–15.5)
WBC: 14.2 10*3/uL — ABNORMAL HIGH (ref 4.0–10.5)

## 2015-01-23 LAB — RAPID URINE DRUG SCREEN, HOSP PERFORMED
Amphetamines: NOT DETECTED
BARBITURATES: NOT DETECTED
BENZODIAZEPINES: NOT DETECTED
Cocaine: NOT DETECTED
Opiates: NOT DETECTED
Tetrahydrocannabinol: POSITIVE — AB

## 2015-01-23 LAB — URINE MICROSCOPIC-ADD ON

## 2015-01-23 LAB — PROTIME-INR
INR: 1.15 (ref 0.00–1.49)
PROTHROMBIN TIME: 14.8 s (ref 11.6–15.2)

## 2015-01-23 LAB — LACTIC ACID, PLASMA: LACTIC ACID, VENOUS: 1.1 mmol/L (ref 0.5–2.0)

## 2015-01-23 LAB — AMMONIA: Ammonia: 34 umol/L — ABNORMAL HIGH (ref 11–32)

## 2015-01-23 MED ORDER — ACETAMINOPHEN 650 MG RE SUPP: 650.0000 mg | Freq: Four times a day (QID) | RECTAL | Status: DC | PRN

## 2015-01-23 MED ORDER — CEFEPIME HCL 1 G IJ SOLR
1.0000 g | Freq: Three times a day (TID) | INTRAMUSCULAR | Status: DC
  Administered 2015-01-23 – 2015-01-25 (×6): 1 g via INTRAVENOUS
  Filled 2015-01-23 (×8): qty 1

## 2015-01-23 MED ORDER — HALOPERIDOL LACTATE 5 MG/ML IJ SOLN
1.0000 mg | Freq: Four times a day (QID) | INTRAMUSCULAR | Status: DC | PRN
  Administered 2015-01-23 – 2015-01-26 (×6): 2 mg via INTRAVENOUS
  Filled 2015-01-23 (×7): qty 1

## 2015-01-23 MED ORDER — ALBUTEROL SULFATE (2.5 MG/3ML) 0.083% IN NEBU: 2.5000 mg | INHALATION_SOLUTION | RESPIRATORY_TRACT | Status: DC | PRN

## 2015-01-23 MED ORDER — LORAZEPAM 2 MG/ML IJ SOLN
1.0000 mg | INTRAMUSCULAR | Status: DC | PRN
  Administered 2015-01-23: 2 mg via INTRAVENOUS

## 2015-01-23 MED ORDER — LACTULOSE 10 GM/15ML PO SOLN
30.0000 g | Freq: Two times a day (BID) | ORAL | Status: DC
  Administered 2015-01-23 – 2015-01-28 (×6): 30 g via ORAL
  Filled 2015-01-23 (×8): qty 45

## 2015-01-23 MED ORDER — GADOBENATE DIMEGLUMINE 529 MG/ML IV SOLN
15.0000 mL | Freq: Once | INTRAVENOUS | Status: AC | PRN
  Administered 2015-01-23: 15 mL via INTRAVENOUS

## 2015-01-23 MED ORDER — ACETAMINOPHEN 325 MG PO TABS
650.0000 mg | ORAL_TABLET | Freq: Four times a day (QID) | ORAL | Status: DC | PRN
  Administered 2015-01-23: 650 mg via ORAL
  Filled 2015-01-23: qty 2

## 2015-01-23 MED ORDER — MORPHINE SULFATE 2 MG/ML IJ SOLN
1.0000 mg | INTRAMUSCULAR | Status: DC | PRN
  Administered 2015-01-23: 1 mg via INTRAVENOUS
  Filled 2015-01-23: qty 1

## 2015-01-23 MED ORDER — LORAZEPAM 2 MG/ML IJ SOLN
INTRAMUSCULAR | Status: AC
  Filled 2015-01-23: qty 1

## 2015-01-23 MED ORDER — ALBUTEROL SULFATE HFA 108 (90 BASE) MCG/ACT IN AERS: 2.0000 | INHALATION_SPRAY | RESPIRATORY_TRACT | Status: DC | PRN

## 2015-01-23 MED ORDER — SODIUM CHLORIDE 0.9 % IV SOLN
INTRAVENOUS | Status: DC
  Administered 2015-01-23 – 2015-01-24 (×4): via INTRAVENOUS

## 2015-01-23 MED ORDER — POTASSIUM CHLORIDE CRYS ER 20 MEQ PO TBCR
20.0000 meq | EXTENDED_RELEASE_TABLET | Freq: Once | ORAL | Status: DC
  Filled 2015-01-23: qty 1
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
## 2015-01-23 NOTE — ED Notes (Signed)
IVC paperwork filed by Mother and provided by police. Police currently turning over custody of patient to hospital. All [employer] restraints have been removed. Staffing office called for sitter.

## 2015-01-23 NOTE — Progress Notes (Signed)
Patient become agitated and verbally aggressive was refusing blood to be drawn was able to calm down.  Patient allowed blood to be drawn, nurse gave patient Haldol 2mg  for agitation.  Patient was taken down to MRI at this time sitter at his side.

## 2015-01-23 NOTE — Progress Notes (Signed)
Patient returned to unit from MRI resting with eyes closed and sitter at bedside.

## 2015-01-23 NOTE — Progress Notes (Signed)
UR completed 
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
## 2015-01-23 NOTE — Progress Notes (Signed)
[facility] belongings(money and a bottle top) to unit. Two officers counted patients money in front of me Monia Pouch(Oluwaferanmi Wain, RN) 4 times. Charge nurse informed me to take patient belongings to security in ED due to patient not being capable of handling his own money. Patients RN Estanislado Emmsshley Schwarz notified, and informed the patient of the whereabouts of his belongings. Envelope and key in placed patients chart. Will need the security box key in chart to get patients belongings upon discharge. Monia PouchShakenna Kaitlynne Wenz, RN
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
## 2015-01-23 NOTE — Progress Notes (Signed)
Pt arrived to room 4N16 from ED. Pt is alert and oriented and in no distress. Sitter at bedside.  Will continue to monitor.

## 2015-01-24 ENCOUNTER — Inpatient Hospital Stay (HOSPITAL_COMMUNITY): Payer: Self-pay

## 2015-01-24 DIAGNOSIS — D72829 Elevated white blood cell count, unspecified: Secondary | ICD-10-CM

## 2015-01-24 DIAGNOSIS — R748 Abnormal levels of other serum enzymes: Secondary | ICD-10-CM

## 2015-01-24 DIAGNOSIS — N179 Acute kidney failure, unspecified: Secondary | ICD-10-CM

## 2015-01-24 DIAGNOSIS — R410 Disorientation, unspecified: Secondary | ICD-10-CM

## 2015-01-24 DIAGNOSIS — E872 Acidosis, unspecified: Secondary | ICD-10-CM

## 2015-01-24 LAB — CBC
HCT: 38.5 % — ABNORMAL LOW (ref 39.0–52.0)
Hemoglobin: 12.8 g/dL — ABNORMAL LOW (ref 13.0–17.0)
MCH: 31.1 pg (ref 26.0–34.0)
MCHC: 33.2 g/dL (ref 30.0–36.0)
MCV: 93.7 fL (ref 78.0–100.0)
PLATELETS: 205 10*3/uL (ref 150–400)
RBC: 4.11 MIL/uL — AB (ref 4.22–5.81)
RDW: 13.1 % (ref 11.5–15.5)
WBC: 6.6 10*3/uL (ref 4.0–10.5)

## 2015-01-24 LAB — AMMONIA: Ammonia: 12 umol/L (ref 11–32)

## 2015-01-24 LAB — VITAMIN B12: Vitamin B-12: 773 pg/mL (ref 211–911)

## 2015-01-24 LAB — HEPATIC FUNCTION PANEL
ALT: 44 U/L (ref 0–53)
AST: 139 U/L — ABNORMAL HIGH (ref 0–37)
Albumin: 3 g/dL — ABNORMAL LOW (ref 3.5–5.2)
Alkaline Phosphatase: 66 U/L (ref 39–117)
BILIRUBIN DIRECT: 0.2 mg/dL (ref 0.0–0.5)
BILIRUBIN INDIRECT: 0.7 mg/dL (ref 0.3–0.9)
BILIRUBIN TOTAL: 0.9 mg/dL (ref 0.3–1.2)
TOTAL PROTEIN: 5.5 g/dL — AB (ref 6.0–8.3)

## 2015-01-24 LAB — BASIC METABOLIC PANEL
Anion gap: 4 — ABNORMAL LOW (ref 5–15)
BUN: <5 mg/dL — ABNORMAL LOW (ref 6–23)
CALCIUM: 8.3 mg/dL — AB (ref 8.4–10.5)
CO2: 25 mmol/L (ref 19–32)
Chloride: 115 mmol/L — ABNORMAL HIGH (ref 96–112)
Creatinine, Ser: 1.14 mg/dL (ref 0.50–1.35)
GFR calc non Af Amer: 82 mL/min — ABNORMAL LOW (ref >90)
Glucose, Bld: 90 mg/dL (ref 70–99)
Potassium: 3.7 mmol/L (ref 3.5–5.1)
Sodium: 144 mmol/L (ref 135–145)

## 2015-01-24 MED ORDER — TRAMADOL HCL 50 MG PO TABS: 50.0000 mg | ORAL_TABLET | Freq: Three times a day (TID) | ORAL | Status: DC | PRN
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
## 2015-01-25 ENCOUNTER — Inpatient Hospital Stay (HOSPITAL_COMMUNITY): Payer: MEDICAID

## 2015-01-25 DIAGNOSIS — F19959 Other psychoactive substance use, unspecified with psychoactive substance-induced psychotic disorder, unspecified: Secondary | ICD-10-CM

## 2015-01-25 LAB — COMPREHENSIVE METABOLIC PANEL
ALT: 66 U/L — ABNORMAL HIGH (ref 0–53)
ANION GAP: 4 — AB (ref 5–15)
AST: 169 U/L — ABNORMAL HIGH (ref 0–37)
Albumin: 2.7 g/dL — ABNORMAL LOW (ref 3.5–5.2)
Alkaline Phosphatase: 73 U/L (ref 39–117)
BILIRUBIN TOTAL: 0.7 mg/dL (ref 0.3–1.2)
CALCIUM: 8.1 mg/dL — AB (ref 8.4–10.5)
CO2: 25 mmol/L (ref 19–32)
Chloride: 110 mmol/L (ref 96–112)
Creatinine, Ser: 1.02 mg/dL (ref 0.50–1.35)
GFR calc non Af Amer: >90 mL/min (ref >90)
GLUCOSE: 84 mg/dL (ref 70–99)
Potassium: 3.7 mmol/L (ref 3.5–5.1)
Sodium: 139 mmol/L (ref 135–145)
TOTAL PROTEIN: 5.4 g/dL — AB (ref 6.0–8.3)

## 2015-01-25 LAB — HIV ANTIBODY (ROUTINE TESTING W REFLEX): HIV Screen 4th Generation wRfx: NONREACTIVE

## 2015-01-25 LAB — RPR: RPR Ser Ql: NONREACTIVE

## 2015-01-25 MED ORDER — TRAMADOL HCL 50 MG PO TABS: 50.0000 mg | ORAL_TABLET | Freq: Three times a day (TID) | ORAL | Status: DC | PRN

## 2015-01-25 NOTE — Progress Notes (Signed)
EEG completed; results pending.    

## 2015-01-25 NOTE — Progress Notes (Signed)
Subjective: Patient has no complaints today.  He is desiring to go home.   Objective: Current vital signs: BP 123/78 mmHg  Pulse 70  Temp(Src) 97.7 F (36.5 C) (Oral)  Resp 20  Ht 6\' 1"  (1.854 m)  Wt 81.647 kg (180 lb)  BMI 23.75 kg/m2  SpO2 100% Vital signs in last 24 hours: Temp:  [97.7 F (36.5 C)-98.7 F (37.1 C)] 97.7 F (36.5 C) (02/22 1029) Pulse Rate:  [61-79] 70 (02/22 1029) Resp:  [18-20] 20 (02/22 1029) BP: (111-166)/(55-79) 123/78 mmHg (02/22 1029) SpO2:  [99 %-100 %] 100 % (02/22 1029)  Intake/Output from previous day:   Intake/Output this shift:   Nutritional status: Diet regular  Neurologic Exam: General: Mental Status: Alert, oriented, thought content appropriate.  Speech fluent without evidence of aphasia.  Able to follow 3 step commands without difficulty. Cranial Nerves: II:  Visual fields grossly normal, pupils equal, round, reactive to light and accommodation III,IV, VI: ptosis not present, extra-ocular motions intact bilaterally V,VII: smile symmetric, facial light touch sensation normal bilaterally VIII: hearing normal bilaterally IX,X: gag reflex present XI: bilateral shoulder shrug XII: midline tongue extension without atrophy or fasciculations  Motor: Right : Upper extremity   5/5    Left:     Upper extremity   5/5  Lower extremity   5/5     Lower extremity   5/5 Tone and bulk:normal tone throughout; no atrophy noted Sensory: Pinprick and light touch intact throughout, bilaterally Deep Tendon Reflexes:  Right: Upper Extremity   Left: Upper extremity   biceps (C-5 to C-6) 2/4   biceps (C-5 to C-6) 2/4 tricep (C7) 2/4    triceps (C7) 2/4 Brachioradialis (C6) 2/4  Brachioradialis (C6) 2/4  Lower Extremity Lower Extremity  quadriceps (L-2 to L-4) 2/4   quadriceps (L-2 to L-4) 2/4 Achilles (S1) 2/4   Achilles (S1) 2/4  Plantars: Right: downgoing   Left: downgoing Cerebellar: normal finger-to-nose,  normal heel-to-shin test Gait:  normal CV: pulses palpable throughout    Lab Results: Basic Metabolic Panel:  Recent Labs Lab 01/21/15 0158 01/22/15 2153 01/23/15 0715 01/24/15 0500 01/25/15 0420  NA 141 141 142 144 139  K 3.6 4.2 3.4* 3.7 3.7  CL 105 104 113* 115* 110  CO2 30 13* 22 25 25   GLUCOSE 123* 130* 77 90 84  BUN 9 12 7  <5* <5*  CREATININE 1.30 2.35* 1.51* 1.14 1.02  CALCIUM 9.0 9.4 8.2* 8.3* 8.1*    Liver Function Tests:  Recent Labs Lab 01/21/15 0158 01/22/15 2153 01/23/15 0715 01/24/15 0800 01/25/15 0420  AST 23 83* 92* 139* 169*  ALT 19 29 30  44 66*  ALKPHOS 109 107 76 66 73  BILITOT 0.6 1.0 1.0 0.9 0.7  PROT 6.6 7.6 6.0 5.5* 5.4*  ALBUMIN 3.7 4.3 3.1* 3.0* 2.7*   No results for input(s): LIPASE, AMYLASE in the last 168 hours.  Recent Labs Lab 01/22/15 2153 01/23/15 0715 01/24/15 0500  AMMONIA 211* 34* 12    CBC:  Recent Labs Lab 01/21/15 0158 01/22/15 2153 01/23/15 0715 01/24/15 0500  WBC 6.3 15.0* 14.2* 6.6  NEUTROABS  --  12.7*  --   --   HGB 15.1 16.1 13.4 12.8*  HCT 44.0 47.3 38.8* 38.5*  MCV 92.2 92.9 91.1 93.7  PLT 246 240 226 205    Cardiac Enzymes: No results for input(s): CKTOTAL, CKMB, CKMBINDEX, TROPONINI in the last 168 hours.  Lipid Panel: No results for input(s): CHOL, TRIG, HDL, CHOLHDL, VLDL, LDLCALC  in the last 168 hours.  CBG: No results for input(s): GLUCAP in the last 168 hours.  Microbiology: Results for orders placed or performed during the hospital encounter of 01/22/15  Culture, blood (routine x 2)     Status: None (Preliminary result)   Collection Time: 01/23/15  8:30 PM  Result Value Ref Range Status   Specimen Description BLOOD LEFT ARM  Final   Special Requests BOTTLES DRAWN AEROBIC AND ANAEROBIC 5CC EACH  Final   Culture   Final           BLOOD CULTURE RECEIVED NO GROWTH TO DATE CULTURE WILL BE HELD FOR 5 DAYS BEFORE ISSUING A FINAL NEGATIVE REPORT Performed at [facility]    Report Status PENDING  Incomplete   Culture, blood (routine x 2)     Status: None (Preliminary result)   Collection Time: 01/23/15  8:40 PM  Result Value Ref Range Status   Specimen Description BLOOD RIGHT ARM  Final   Special Requests BOTTLES DRAWN AEROBIC AND ANAEROBIC 5CC  Final   Culture   Final           BLOOD CULTURE RECEIVED NO GROWTH TO DATE CULTURE WILL BE HELD FOR 5 DAYS BEFORE ISSUING A FINAL NEGATIVE REPORT Performed at [facility]    Report Status PENDING  Incomplete    Coagulation Studies:  Recent Labs  01/23/15 2030  LABPROT 14.8  INR 1.15    Imaging: Mr Laqueta Jean Wo Contrast  01/24/2015   CLINICAL DATA:  Initial evaluation for acute altered mental status, confusion. Straight schizophrenia.  EXAM: MRI HEAD WITHOUT AND WITH CONTRAST  TECHNIQUE: Multiplanar, multiecho pulse sequences of the brain and surrounding structures were obtained without and with intravenous contrast.  CONTRAST:  15mL MULTIHANCE GADOBENATE DIMEGLUMINE 529 MG/ML IV SOLN  COMPARISON:  Prior study from 01/22/2015 one.  FINDINGS: The CSF containing spaces are within normal limits for patient age. No focal parenchymal signal abnormality is identified. No mass lesion, midline shift, or extra-axial fluid collection. Ventricles are normal in size without evidence of hydrocephalus.  No diffusion-weighted signal abnormality is identified to suggest acute intracranial infarct. Gray-white matter differentiation is maintained. Normal flow voids are seen within the intracranial vasculature. No intracranial hemorrhage identified.  The cervicomedullary junction is normal. Pituitary gland is within normal limits. Pituitary stalk is midline. The globes and optic nerves demonstrate a normal appearance with normal signal intensity.  No abnormal enhancement identified on post-contrast sequences.  The bone marrow signal intensity is normal. Calvarium is intact. Visualized upper cervical spine is within normal limits.  Scalp soft tissues are unremarkable.   Mild mucoperiosteal thickening present within the inferior left maxillary sinus as well as the ethmoidal air cells. Scattered fluid density also noted within the mastoid air cells bilaterally, greater on the right.  IMPRESSION: Normal brain MRI with no acute intracranial abnormality identified.   Electronically Signed   By: Rise Mu M.D.   On: 01/24/2015 01:10   US Abdomen Limited Ruq  01/24/2015   CLINICAL DATA:  Elevated transaminases, history of drug and ethanol abuse  EXAM: US ABDOMEN LIMITED - RIGHT UPPER QUADRANT  COMPARISON:  None  FINDINGS: Gallbladder:  Normally distended without stones or wall thickening.  No pericholecystic fluid or sonographic Murphy sign.  Common bile duct:  Diameter: Normal caliber 3 mm diameter  Liver:  Normal appearance  No significant ascites.  IMPRESSION: No significant RIGHT upper quadrant sonographic abnormalities.   Electronically Signed   By: Loraine Leriche  Tyron Russell M.D.   On: 01/24/2015 13:05    Medications:  Scheduled: . ceFEPime (MAXIPIME) IV  1 g Intravenous 3 times per day  . lactulose  30 g Oral BID  . potassium chloride  20 mEq Oral Once    EEG: Normal electroencephalogram, awake, asleep and with activation procedures. There are no focal lateralizing or epileptiform features.    Felicie Morn PA-C [facility] [phone]  01/25/2015, 11:29 AM   Patient seen and examined.  Clinical course and management discussed.  Necessary edits performed.  I agree with the above.  Assessment and plan of care developed and discussed below.   Assessment/Plan: 34 year old male with history of schizophrenia and substance abuse admitted through the emergency department after being found at his neighbor's home.  MRI of the brain personally reviewed and unremarkable.  EEG is normal and patient continues to show no neurological deficits.  Recommendations: 1.  Agree with transfer to psych  Thana Farr, MD [facility] [phone]  01/25/2015  11:41 AM

## 2015-01-25 NOTE — Progress Notes (Signed)
Nurse informed MD, Dr. Sunnie Nielsenegalado that pt has peripheral IV in place but refuses IV fluids to be connected. Md called back. No new orders. Will continue to monitor pt closely. Sitter at bedside.   Seth Kim, Seth Kim 01/25/2015 6:39 PM

## 2015-01-25 NOTE — Progress Notes (Signed)
Pt walked out of room and stated he wanted to leave and was trying to find his clothes. Pt verbally aggressive, anxious, and agitated. Sitter at bedside but did not follow pt and said pt got on elevator. Nurse made aware of situation and looked for pt and found pt on 2nd floor north tower. Pt had blanket over shoulders asking nurse where to find clothes. pt visibly agitated, anxious. Copywriter, advertisingitter and charge RN, Kennith Centerracey present. Pt escorted back to room. Security arrived at pt's bedside and calmed pt down. Will continue to monitor pt closely   Andrew AuVafiadis, Diaz Crago I 01/25/2015 3:06 PM

## 2015-01-25 NOTE — Progress Notes (Addendum)
Pt requested to go out to smoke. Nurse advised pt that pt cannot leave. Pt got angry and agitated. Security called and arrived at bedside and was able to calm.  Will continue to monitor pt closely. Sitter at bedside. Nurse informed MD. No new orders. Lonell FaceVafiadis, Darshana Curnutt I 01/25/2015 6:38 PM
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
## 2015-01-25 NOTE — Procedures (Signed)
ELECTROENCEPHALOGRAM REPORT   Patient: Seth Kim       Room #: 4N-16 EEG No. ID:  Age: 34 y.o.        Sex: male Referring Physician: Dr Sunnie Nielsenegalado Report Date:  01/25/2015        Interpreting Physician: Elspeth ChoSUMNER, Stefan Karen, Jill AlexandersJUSTIN  History: Seth Kim is an 34 y.o. male hx of schizophrenia admitted with altered mental status  Medications:  Scheduled: . ceFEPime (MAXIPIME) IV  1 g Intravenous 3 times per day  . lactulose  30 g Oral BID  . potassium chloride  20 mEq Oral Once    Conditions of Recording:  This is a 16 channel EEG carried out with the patient in the drowsy state.  Description:  The waking background activity consists of a low voltage, symmetrical, fairly well organized, 8-10 Hz alpha activity, seen from the parieto-occipital and posterior temporal regions. There is frequent theta activity intermixed in.  Low voltage fast activity, poorly organized, is seen anteriorly and is at times superimposed on more posterior regions.  A mixture of theta and alpha rhythms are seen from the central and temporal regions. No focal slowing or epileptiform activity is noted.   The patient drowses with slowing to irregular, low voltage theta and beta activity. Stage II sleep is not obtained.  (Hyperventilation produced a mild to moderate buildup but failed to elicit any abnormalities.  Intermittent photic stimulation was performed and elicits a symmetrical driving response but fails to elicit any abnormalities.  IMPRESSION: Normal electroencephalogram, awake, asleep and with activation procedures. There are no focal lateralizing or epileptiform features. A normal EEG does not rule out the possibility of an underlying seizure disorder.    Elspeth Choeter Jamonica Schoff, DO [facility] [phone]  If 7pm- 7am, please page neurology on call as listed in AMION. 01/25/2015, 10:08 AM

## 2015-01-26 DIAGNOSIS — F1494 Cocaine use, unspecified with cocaine-induced mood disorder: Secondary | ICD-10-CM

## 2015-01-26 DIAGNOSIS — F1210 Cannabis abuse, uncomplicated: Secondary | ICD-10-CM

## 2015-01-26 DIAGNOSIS — F1414 Cocaine abuse with cocaine-induced mood disorder: Secondary | ICD-10-CM

## 2015-01-26 LAB — HEPATITIS PANEL, ACUTE
HCV Ab: NEGATIVE
Hep A IgM: NONREACTIVE
Hep B C IgM: NONREACTIVE
Hepatitis B Surface Ag: NEGATIVE

## 2015-01-26 LAB — RAPID URINE DRUG SCREEN, HOSP PERFORMED
Amphetamines: NOT DETECTED
BARBITURATES: NOT DETECTED
BENZODIAZEPINES: NOT DETECTED
Cocaine: NOT DETECTED
Opiates: NOT DETECTED
Tetrahydrocannabinol: POSITIVE — AB

## 2015-01-26 LAB — VITAMIN B1: VITAMIN B1 (THIAMINE): 139.2 nmol/L (ref 66.5–200.0)

## 2015-01-26 MED ORDER — NICOTINE 21 MG/24HR TD PT24
21.0000 mg | MEDICATED_PATCH | Freq: Every day | TRANSDERMAL | Status: DC
  Administered 2015-01-26: 21 mg via TRANSDERMAL
  Filled 2015-01-26 (×3): qty 1

## 2015-01-26 MED ORDER — RISPERIDONE 0.5 MG PO TBDP: 0.5000 mg | ORAL_TABLET | Freq: Two times a day (BID) | ORAL | Status: DC

## 2015-01-26 MED ORDER — ZOLPIDEM TARTRATE 5 MG PO TABS
5.0000 mg | ORAL_TABLET | Freq: Once | ORAL | Status: AC
  Administered 2015-01-26: 5 mg via ORAL
  Filled 2015-01-26: qty 1

## 2015-01-26 MED ORDER — NICOTINE 21 MG/24HR TD PT24: 21.0000 mg | MEDICATED_PATCH | Freq: Every day | TRANSDERMAL | Status: DC

## 2015-01-26 MED ORDER — RISPERIDONE 0.5 MG PO TBDP
0.5000 mg | ORAL_TABLET | Freq: Two times a day (BID) | ORAL | Status: DC
  Administered 2015-01-26 – 2015-01-27 (×3): 0.5 mg via ORAL
  Filled 2015-01-26 (×5): qty 1
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
## 2015-01-26 NOTE — Progress Notes (Signed)
Nurse notified MD that 12 lead EKG has been completed. Will monitor  Andrew AuVafiadis, Averi Kilty I 01/26/2015 1:59 PM
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
## 2015-01-26 NOTE — Progress Notes (Addendum)
Pt agitated and verbally aggressive. Pt stated, "All I want is some weed and a cigarette." pt repeated statement continuosly. Pt in hallway pacing back and forth. Sitter and nurse present. Security called and arrived to unit and asked pt to go to room. Pt refused and started yelling, "I want to go home. Somebody just shoot me. Take me to jail." pt repeated statement several times out loud.  Pt returned to room. Md, Dr. Sunnie Nielsenegalado, arrived at pt's bedside.  Pt continued to yell and sated he wanted to go home. Md ordered wrist, leg and posey belt restraint. With assistance from security, restraints placed. Pt agitated. Haldol admin as charted. Sitter at bedside.  Pt appearing less agitated after restraint placement and haldol admin.  Pt refused for us to contact family member including mother. Will continue to monitor pt closely   .Andrew AuVafiadis, Kailly Richoux I   01/26/2015 11:02 AM

## 2015-01-26 NOTE — Clinical Social Work Psych Note (Signed)
Psych CSW reached out to Medical Director for assistance with placement options.  Psych CSW spoke with Charge at [facility].  [facility] has high acuity at this time.  Psych CSW has also contacted [facility].  Patient is currently under review at [facility].    Barrier to discharge: no payer source; aggressive behaviors  Psych CSW has sent referrals to local and extended area facilities and updated MD.  Psych CSW will continue to assist with disposition.  Vickii PennaGina Analilia Geddis, LCSWA [phone]  Psychiatric & Orthopedics (5N 1-16) Clinical Social Worker

## 2015-01-26 NOTE — Progress Notes (Addendum)
Pt removed restraints by self per charge RN, Kennith Centerracey. Family and sitter at side. Pt calm. Will continue to monitor pt and need for restraints.   Andrew AuVafiadis, Tatia Petrucci I 01/26/2015 5:32 PM

## 2015-01-26 NOTE — Progress Notes (Signed)
Pt was able to get out of his restraints.  Standing in room swaying and raising hands.  Security called.  Family at bedside.  Will attempt to keep pt out of restraints.  Medicated.  Will continue to monitor closely.  Sitter in place.

## 2015-01-26 NOTE — Clinical Social Work Psych Note (Addendum)
Psych CSW spoke with patient's mother, [place], per mother's request.    Hallucinations: Mother states that patient has been "acting strange" for the past 2-3 months.  Mother states she has walked in on the patient talking to himself and getting angry with "someone" but no one would be in the room with him.  Per patient's mother, patient reported to ED physician that he had been hearing voices, but could not account for the period of onset.  SA: Mother reports she had listened in on a conversation being had between patient and one of the patient's friends.  [place] reports she heard the voice on the other end of the phone say, "I have the crank".  Mother has suspected the patient's use of illicit drugs for "a while".  Patient was positive for cocaine and THC upon admission.  Mother has no knowledge of the types of drugs he uses.  Mother did report that she was aware that patient "smoked weed" and "drank".  Mother reports usage being weekly/almost daily.  Behaviors:  Mother reports for the past 2-3 months, patient is more aggressive, combative, argumentative and irritable. [place] states that she has not seen the patient become physically aggressive, only verbally aggressive.  Paranoia: [place] explains that patient has often told her"they can see us", "they can see us", "they're here to get us", "they're here to get us".  [place] explains there was no threat or assumed threat and the paranoia had no basis.  Living Situation: Per patient mother, patient currently lives with her and is not employed.  Involuntarily Committed: Patient's mother was directed by the ED to have patient IVC'd so patient would not be able to leave hospital AMA and receive the treatment he needed prior to discharge.    Disposition: Inpatient Psychiatric Hospital- mother aware and agreeable  Barriers to discharge: aggressive behaviors; no payer source  Psych CSW continuing to assist with disposition and placement.  Psych CSW  has sought the counsel of Medical Director in regards to placement options.  Vickii PennaGina Maceo Hernan, LCSWA [phone]  Psychiatric & Orthopedics (5N 1-16) Clinical Social Worker      [place], patient's mother can be reached at home at: [phone] or on her cell at [phone]

## 2015-01-27 DIAGNOSIS — R7401 Elevation of levels of liver transaminase levels: Secondary | ICD-10-CM | POA: Insufficient documentation

## 2015-01-27 DIAGNOSIS — R740 Nonspecific elevation of levels of transaminase and lactic acid dehydrogenase [LDH]: Secondary | ICD-10-CM

## 2015-01-27 LAB — BASIC METABOLIC PANEL
ANION GAP: 5 (ref 5–15)
BUN: 6 mg/dL (ref 6–23)
CALCIUM: 8.5 mg/dL (ref 8.4–10.5)
CO2: 28 mmol/L (ref 19–32)
Chloride: 108 mmol/L (ref 96–112)
Creatinine, Ser: 0.92 mg/dL (ref 0.50–1.35)
GFR calc Af Amer: >90 mL/min (ref >90)
Glucose, Bld: 80 mg/dL (ref 70–99)
Potassium: 3.7 mmol/L (ref 3.5–5.1)
SODIUM: 141 mmol/L (ref 135–145)

## 2015-01-27 LAB — CERULOPLASMIN: Ceruloplasmin: 28 mg/dL (ref 18–36)

## 2015-01-27 MED ORDER — HYDROXYZINE HCL 25 MG PO TABS: 25.0000 mg | ORAL_TABLET | Freq: Three times a day (TID) | ORAL | Status: DC | PRN

## 2015-01-27 MED ORDER — RISPERIDONE 0.5 MG PO TABS
0.5000 mg | ORAL_TABLET | Freq: Two times a day (BID) | ORAL | Status: DC
  Administered 2015-01-28: 0.5 mg via ORAL
  Filled 2015-01-27 (×2): qty 1

## 2015-01-27 MED ORDER — ZOLPIDEM TARTRATE 5 MG PO TABS
5.0000 mg | ORAL_TABLET | Freq: Every evening | ORAL | Status: DC | PRN
  Administered 2015-01-27: 5 mg via ORAL
  Filled 2015-01-27: qty 1

## 2015-01-27 MED ORDER — ENOXAPARIN SODIUM 40 MG/0.4ML ~~LOC~~ SOLN
40.0000 mg | SUBCUTANEOUS | Status: DC
  Administered 2015-01-27: 40 mg via SUBCUTANEOUS
  Filled 2015-01-27: qty 0.4

## 2015-01-27 NOTE — Consult Note (Signed)
Psychiatry Consult follow up   Reason for Consult:  "grandiose delusions, restlessness" Referring Physician:  Dr. Tyrell Antonio  Patient Identification: Seth Kim MRN:  [id number] Principal Diagnosis: Altered mental status, substance-induced psychosis Diagnosis:   Patient Active Problem List   Diagnosis Date Noted  . AKI (acute kidney injury) [N17.9] 01/24/2015  . Lactic acidosis [E87.2] 01/24/2015  . Leukocytosis [D72.829] 01/24/2015  . Abnormal transaminases [R74.0] 01/24/2015  . Altered mental status [R41.82] 01/23/2015  . Paranoid delusion [F22] 01/23/2015    Total Time spent with patient: 20 minutes  Subjective:   Seth Kim is a 34 y.o. male patient admitted with altered mental status. Pt was interviewed today. Chart reviewed. Pt does have sitter at bedside. Pt was very tired, and difficult to arouse. Pt reported not know why he is in the hospital. Pt was oriented to knowing that he was at [facility], and knew his name. However, he believed the year was 2014. He was mumbling and exhibited disorganized speech. When asked if he used drugs or alcohol, he stated "3 times". He denied SI/HI/AVH. He denied a h/o taking psychotropic meds, seeing a psychiatrist, or psychiatric hospitalizations. When asked why he was on a neighbor's property, he stated "running, running, running, I have a facebook page that explains everything." Sitter reports pt becomes agitated intermittently, eats well, mostly sleeping. RN reports pt was given ativan this AM for agitation, which was effective. Pt has been talking to himself, and pointing under the chair. Pt jumped up earlier today, got in the RN's face, and stated "did you say the N word?". Per medical record review: "Seth Kim is a 34 y.o. male who was brought to the ED after being found on his neighbor's property on 01/22/15. The patient is noncompliant with questions, often giving very inappropriate and profane responses. Most of this history has come  from his medical record and his mother (patient gave me permission to call her). Unfortunately, his mother reports at least a one month history of behavioral changes, mood swings, manic-type behavior (fast, pressured speech and staying up all night), auditory hallucinations, and paranoid delusions. The patient was in the ED one night prior, brought in by his mom, because she was concerned that he needed to be committed and evaluated. He was noncompliant at that time, and urine drug screen was noted to be positive for [facility] and cocaine. A psych evaluation was performed, but the patient was ultimately discharged to home. He comes back in tonight, but no one seems to have clear idea of what he was doing at his neighbor's house or how he developed multiple lacerations to his head, face, and bilateral upper extremities. Repeat ingestions highly suspected. He also has evidence of AKI with a metabolic acidosis (elevated lactic acid level). He is being admitted to the medicine service for aggressive hydration and medical clearance before psych referral. Of note, his mother also reports that he has been using some type of performance enhancer, purchased from [employer], intermittently for the past few weeks."  Interval history: Met with patient face-to-face for psychiatric consultation follow-up. Case discussed with Nonnie Done, LCSW for appropriate acute psychiatric hospital placement. Patient appeared lying down in a couch and stated that he is feeling restless and not able to feel comfortable in his bed. He has been off soft restraints since he has been calm and cooperative without agitation and aggression since yesterday. Patient has a Air cabin crew in his room who stated that patient has no reported behavioral problems  since yesterday and staff RN and physician is awaiting for psych directions at this time. Dr. Darrick Meigs stated that he has been medically stable for psych placement. Patient is disorganized, paranoid,  delusional, grandiose and reports using synthetic marijuana about five blunts a day and drinking vodka daily. 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 All his medical workup including MRI of the brain and EEG has been negative for brain abnormalities. Patient urine drug screen is positive for cocaine and tetrahydrocannabinol about 4 days ago. Patient has a limited insight, judgment and impulse control.   Past Medical History:  Past Medical History  Diagnosis Date  . Asthma   . Schizophrenia    History reviewed. No pertinent past surgical history. Family History: History reviewed. No pertinent family history. Social History:  History  Alcohol Use  . Yes    Comment: Drinks heavily per mother      History  Drug Use  . Yes  . Special: Cocaine, Marijuana    Comment: Per labs     History   Social History  . Marital Status: Single    Spouse Name: N/A  . Number of Children: N/A  . Years of Education: N/A   Social History Main Topics  . Smoking status: Current Every Day Smoker  . Smokeless tobacco: Not on file  . Alcohol Use: Yes     Comment: Drinks heavily per mother   . Drug Use: Yes    Special: Cocaine, Marijuana     Comment: Per labs   . Sexual Activity: Not on file   Other Topics Concern  . None   Social History Narrative   Additional Social History:        Allergies:   Allergies  Allergen Reactions  . Penicillins Other (See Comments)    unknown    Vitals: Blood pressure 116/64, pulse 73, temperature 98.4 F (36.9 C), temperature source Oral, resp. rate 18, height $RemoveBe'6\' 1"'IFPARPFOv$  (1.854 m), weight 81.647 kg (180 lb), SpO2 94 %.  Risk to Self: Is patient at risk for suicide?: No Risk to Others:   Prior Inpatient Therapy:   Prior Outpatient Therapy:    Current Facility-Administered Medications  Medication Dose Route Frequency Provider Last Rate Last Dose  . 0.9 %  sodium chloride infusion   Intravenous Continuous Eber Jones, MD 125 mL/hr at 01/24/15 1008    . albuterol (PROVENTIL) (2.5 MG/3ML) 0.083% nebulizer solution 2.5 mg  2.5 mg Nebulization Q4H PRN Eber Jones, MD      . haloperidol lactate (HALDOL) injection 1-2 mg  1-2 mg Intravenous Q6H PRN Belkys A Regalado, MD   2 mg at 01/26/15 1529  . lactulose (CHRONULAC) 10 GM/15ML solution 30 g  30 g Oral BID Belkys A Regalado, MD   30 g at 01/27/15 1117  . morphine 2 MG/ML injection 1 mg  1 mg Intravenous Q4H PRN Belkys A Regalado, MD   1 mg at 01/23/15 1353  . nicotine (NICODERM CQ - dosed in mg/24 hours) patch 21 mg  21 mg Transdermal Daily Belkys A Regalado, MD   21 mg at 01/26/15 1056  . potassium chloride SA (K-DUR,KLOR-CON) CR tablet 20 mEq  20 mEq Oral Once Belkys A Regalado, MD   20 mEq at 01/23/15 1030  . risperiDONE (RISPERDAL M-TABS) disintegrating tablet 0.5 mg  0.5 mg Oral BID Durward Parcel, MD   0.5 mg at 01/27/15 1117  . traMADol (ULTRAM) tablet 50 mg  50 mg Oral Q8H PRN Belkys A Regalado, MD        Musculoskeletal: Strength & Muscle Tone: within normal limits Gait & Station: lying in bed Patient leans: N/A  Psychiatric Specialty Exam: Physical Exam  ROS  Blood pressure 116/64, pulse 73, temperature 98.4 F (36.9 C), temperature source Oral, resp. rate 18, height _0  (1.854 m), weight 81.647 kg (180 lb), SpO2 94 %.Body mass index is 23.75 kg/(m^2).  General Appearance: Disheveled and Guarded  Eye Contact::  None  Speech:  Garbled  Volume:  Decreased  Mood:  Irritable  Affect:  Congruent  Thought Process:  Disorganized and Irrelevant  Orientation:  Other:  oriented to name and place. disoriented to time.  Thought Content:  appears to be responding to internal stimuli and has a grandiose delusions   Suicidal Thoughts:  No  Homicidal Thoughts:  No  Memory:  Immediate;   Poor Recent;   Poor Remote;   Poor  Judgement:  Poor  Insight:  Shallow  Psychomotor Activity:  Decreased  Concentration:  Poor  Recall:   Poor  Fund of Knowledge:Poor  Language: Poor  Akathisia:  Negative  Handed:  Right  AIMS (if indicated):     Assets:  Social Support  ADL's:  Intact  Cognition: Impaired,  Severe  Sleep:      Medical Decision Making: New problem, with additional work up planned, Decision to obtain old records (1) and Review and summation of old records (2)  Treatment Plan Summary: Daily contact with patient to assess and evaluate symptoms and progress in treatment, Medication management and Plan : reviewed previous ED psych consult.    1. Continue 1:1 Air cabin crew.  2. continue risperdal 0.5 mg twice daily for agitation.  3. Would consider hydroxyzine for anxiety 4. Avoid ativan (benzos) due to substance abuse  Plan:  Refer to the psychiatric social service for appropriate acute psychiatric hospital bed for crisis evaluation, safety monitoring and medication management. Recommend psychiatric Inpatient admission when medically cleared.   Disposition: Patient continued to be disorganized, paranoid, grandiose delusions and psychotic so we will continue to recommend admit to inpatient psychiatry.   Ronel Rodeheaver,JANARDHAHA R. 01/27/2015 3:00 PM

## 2015-01-27 NOTE — Clinical Social Work Psych Note (Signed)
Psych CSW reviewed patient's disposition with psychiatry.  Psych CSW [facility] for possible admission today.  Psych CSW awaiting a return call from [employer] RN.  Vickii PennaGina Laurabeth Yip, LCSWA [phone]  Psychiatric & Orthopedics (5N 1-16) Clinical Social Worker

## 2015-01-27 NOTE — Progress Notes (Signed)
[facility] PROGRESS NOTE  Seth PEREZPEREZ ZOX:[id number] DOB: [birth year] DOA: 01/22/2015 PCP: No PCP Per Patient  Assessment/Plan: 1. Altered mental status, paranoia, delusions- patient seen by psych has been started on Haldol when necessary for agitation. Patient is IVC. CT head normal MRA negative for stroke. HIV RPR negative, thiamine ceruloplasmin, copper pending. Blood cultures negative and 12 773. Ammonia level was 200 at the time of admission which decreased to 34-30. EEG was negative for seizure. Psych started the patient on the Risperdal  0.5 twice a day. 2. Acute kidney injury- patient had mild elevation of creatinine which improved with IV fluids 3. Hypokalemia-resolved 4. Transaminitis- mild elevation of liver enzymes, AST 169, ALT 66. Abdominal ultrasound is negative shows normal liver. Check viral hepatitis panel. Repeat CMP in a.m. 5. Lactic acidosis? Cause- lactic acid was elevated to 14 and has come down to 1.1.  6. Laceration right forearm and right hand- patient has laceration length 3 cm and 2 cm respectively status post suturing. 7. Leukocytosis- patient received 4 days of cefepime, blood cultures are negative to date, is currently off the antibiotics. Leukocytosis has now resolved. 8. DVT prophylaxis-Lovenox  Code Status: Full code Family Communication: *No family at bedside Disposition Plan: Inpatient psych   Consultants:  Psychiatry  Procedures:  None  Antibiotics:  None  HPI/Subjective: 34 y.o. male who was brought to the ED after being found on his neighbor's property on 01/22/15.  The patient is noncompliant with questions, often giving very inappropriate and profane responses. Most of this history has come from his medical record and his mother (patient gave me permission to call her). Unfortunately, his mother reports at least a one month history of behavioral changes, mood swings, manic-type behavior (fast, pressured speech and staying up all  night), auditory hallucinations, and paranoid delusions. The patient was in the ED one night prior, brought in by his mom, because she was concerned that he needed to be committed and evaluated. He was noncompliant at that time, and urine drug screen was noted to be positive for [facility] and cocaine. A psych evaluation was performed, but the patient was ultimately discharged to home. He comes back in tonight, but no one seems to have clear idea of what he was doing at his neighbor's house or how he developed multiple lacerations to his head, face, and bilateral upper extremities. Repeat ingestions highly suspected. He also has evidence of AKI with a metabolic acidosis (elevated lactic acid level). He is being admitted to the medicine service for aggressive hydration and medical clearance before psych referral.  Patient today denies any new complaints  Objective: Filed Vitals:   01/27/15 0931  BP: 116/64  Pulse: 73  Temp: 98.4 F (36.9 C)  Resp: 18    Intake/Output Summary (Last 24 hours) at 01/27/15 1526 Last data filed at 01/27/15 0700  Gross per 24 hour  Intake   1320 ml  Output    600 ml  Net    720 ml   Filed Weights   01/22/15 2142  Weight: 81.647 kg (180 lb)    Exam:   General:  *Appear in no acute distress  Cardiovascular: S1-S2 regular  Respiratory: Clear bilaterally  Abdomen: *Soft nontender no organomegaly  Musculoskeletal: No edema of the lower extremities  Data Reviewed: Basic Metabolic Panel:  Recent Labs Lab 01/22/15 2153 01/23/15 0715 01/24/15 0500 01/25/15 0420 01/27/15 0430  NA 141 142 144 139 141  K 4.2 3.4* 3.7 3.7 3.7  CL 104 113* 115*  110 108  CO2 13* 22 25 25 28   GLUCOSE 130* 77 90 84 80  BUN 12 7 <5* <5* 6  CREATININE 2.35* 1.51* 1.14 1.02 0.92  CALCIUM 9.4 8.2* 8.3* 8.1* 8.5   Liver Function Tests:  Recent Labs Lab 01/21/15 0158 01/22/15 2153 01/23/15 0715 01/24/15 0800 01/25/15 0420  AST 23 83* 92* 139* 169*  ALT 19 29 30   44 66*  ALKPHOS 109 107 76 66 73  BILITOT 0.6 1.0 1.0 0.9 0.7  PROT 6.6 7.6 6.0 5.5* 5.4*  ALBUMIN 3.7 4.3 3.1* 3.0* 2.7*   No results for input(s): LIPASE, AMYLASE in the last 168 hours.  Recent Labs Lab 01/22/15 2153 01/23/15 0715 01/24/15 0500  AMMONIA 211* 34* 12   CBC:  Recent Labs Lab 01/21/15 0158 01/22/15 2153 01/23/15 0715 01/24/15 0500  WBC 6.3 15.0* 14.2* 6.6  NEUTROABS  --  12.7*  --   --   HGB 15.1 16.1 13.4 12.8*  HCT 44.0 47.3 38.8* 38.5*  MCV 92.2 92.9 91.1 93.7  PLT 246 240 226 205   Cardiac Enzymes: No results for input(s): CKTOTAL, CKMB, CKMBINDEX, TROPONINI in the last 168 hours. BNP (last 3 results) No results for input(s): BNP in the last 8760 hours.  ProBNP (last 3 results) No results for input(s): PROBNP in the last 8760 hours.  CBG: No results for input(s): GLUCAP in the last 168 hours.  Recent Results (from the past 240 hour(s))  Culture, blood (routine x 2)     Status: None (Preliminary result)   Collection Time: 01/23/15  8:30 PM  Result Value Ref Range Status   Specimen Description BLOOD LEFT ARM  Final   Special Requests BOTTLES DRAWN AEROBIC AND ANAEROBIC 5CC EACH  Final   Culture   Final           BLOOD CULTURE RECEIVED NO GROWTH TO DATE CULTURE WILL BE HELD FOR 5 DAYS BEFORE ISSUING A FINAL NEGATIVE REPORT Performed at [facility]    Report Status PENDING  Incomplete  Culture, blood (routine x 2)     Status: None (Preliminary result)   Collection Time: 01/23/15  8:40 PM  Result Value Ref Range Status   Specimen Description BLOOD RIGHT ARM  Final   Special Requests BOTTLES DRAWN AEROBIC AND ANAEROBIC 5CC  Final   Culture   Final           BLOOD CULTURE RECEIVED NO GROWTH TO DATE CULTURE WILL BE HELD FOR 5 DAYS BEFORE ISSUING A FINAL NEGATIVE REPORT Performed at [facility]    Report Status PENDING  Incomplete     Studies: No results found.  Scheduled Meds: . lactulose  30 g Oral BID  . nicotine  21  mg Transdermal Daily  . potassium chloride  20 mEq Oral Once  . risperiDONE  0.5 mg Oral BID   Continuous Infusions: . sodium chloride 125 mL/hr at 01/24/15 1008    Principal Problem:   Altered mental status Active Problems:   Paranoid delusion   AKI (acute kidney injury)   Lactic acidosis   Leukocytosis   Abnormal transaminases    Time spent: 20 min    [facility],GAGAN S  [facility] Pager [phone]. If 7PM-7AM, please contact night-coverage at [email or web address], password [email or web address] 01/27/2015, 3:26 PM  LOS: 4 days

## 2015-01-28 DIAGNOSIS — D72829 Elevated white blood cell count, unspecified: Secondary | ICD-10-CM

## 2015-01-28 DIAGNOSIS — F22 Delusional disorders: Principal | ICD-10-CM

## 2015-01-28 LAB — CBC
HCT: 42.4 % (ref 39.0–52.0)
Hemoglobin: 14 g/dL (ref 13.0–17.0)
MCH: 31 pg (ref 26.0–34.0)
MCHC: 33 g/dL (ref 30.0–36.0)
MCV: 93.8 fL (ref 78.0–100.0)
Platelets: 294 10*3/uL (ref 150–400)
RBC: 4.52 MIL/uL (ref 4.22–5.81)
RDW: 12.7 % (ref 11.5–15.5)
WBC: 6.7 10*3/uL (ref 4.0–10.5)

## 2015-01-28 LAB — COMPREHENSIVE METABOLIC PANEL
ALBUMIN: 3.4 g/dL — AB (ref 3.5–5.2)
ALK PHOS: 106 U/L (ref 39–117)
ALT: 104 U/L — ABNORMAL HIGH (ref 0–53)
AST: 78 U/L — AB (ref 0–37)
Anion gap: 9 (ref 5–15)
BUN: 8 mg/dL (ref 6–23)
CO2: 31 mmol/L (ref 19–32)
Calcium: 9.3 mg/dL (ref 8.4–10.5)
Chloride: 101 mmol/L (ref 96–112)
Creatinine, Ser: 1.08 mg/dL (ref 0.50–1.35)
GFR calc Af Amer: >90 mL/min (ref >90)
GFR, EST NON AFRICAN AMERICAN: 87 mL/min — AB (ref >90)
Glucose, Bld: 66 mg/dL — ABNORMAL LOW (ref 70–99)
POTASSIUM: 4.3 mmol/L (ref 3.5–5.1)
Sodium: 141 mmol/L (ref 135–145)
Total Bilirubin: 0.6 mg/dL (ref 0.3–1.2)
Total Protein: 6.3 g/dL (ref 6.0–8.3)

## 2015-01-28 LAB — COPPER, SERUM: Copper: 110 ug/dL (ref 70–175)

## 2015-01-28 MED ORDER — HYDROXYZINE HCL 25 MG PO TABS: 25.0000 mg | ORAL_TABLET | Freq: Three times a day (TID) | ORAL | Status: DC | PRN

## 2015-01-28 MED ORDER — RISPERIDONE 0.5 MG PO TBDP: 0.5000 mg | ORAL_TABLET | Freq: Two times a day (BID) | ORAL | Status: DC
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
## 2015-01-28 NOTE — Progress Notes (Signed)
Discharge orders received.  Discharge instructions and follow-up appointments reviewed with the patient.  Explained in detail to the patient the importance of taking his medication as prescribed.  Patient in agreement to take his medications.  VSS upon discharge.  Belongings retrieved from security office and given to the patient, mother present.  Unable to locate patient's clothes and shoes.  Stated that "they had to cut my clothes off".  Mother states " they probably took them for evidence".  Patient walked out in hospital scrubs, gait steady. Sondra ComeSilva, Roniya Tetro M, RN

## 2015-01-28 NOTE — Progress Notes (Signed)
Patient's mother was concerned that he may not be stable for discharge home. Discussed in detail the psych recommendations, also confirmed with the patient that he is not having any suicidal or homicidal ideations. Patient says he will take the medications as prescribed.

## 2015-01-28 NOTE — Clinical Social Work Psych Note (Signed)
Patient has been set up for outpatient follow-up with [facility] (walk in only Monday-Thursday until 4:30).  This information has also been placed on the patient's discharge summary.  Patient's Involuntary Commitment Paperwork has been rescinded per psychiatrist request.  Seth Kim, LCSWA [phone]  Psychiatric & Orthopedics (5N 1-16) Clinical Social Worker

## 2015-01-30 LAB — CULTURE, BLOOD (ROUTINE X 2)
CULTURE: NO GROWTH
Culture: NO GROWTH

## 2015-02-03 LAB — HEAVY METALS SCREEN, URINE

## 2015-02-03 LAB — HEAVY METALS, RANDOM URINE: Creatinine Random, Urine: 91.7 mg/dL (ref 20.0–370.0)

## 2017-01-31 ENCOUNTER — Ambulatory Visit (HOSPITAL_COMMUNITY)
Admission: EM | Admit: 2017-01-31 | Discharge: 2017-01-31 | Disposition: A | Discharge status: Home / Self Care | Payer: Self-pay | Attending: Family Medicine | Admitting: Family Medicine

## 2017-01-31 ENCOUNTER — Encounter (HOSPITAL_COMMUNITY): Payer: Self-pay | Admitting: Emergency Medicine

## 2017-01-31 DIAGNOSIS — H1032 Unspecified acute conjunctivitis, left eye: Secondary | ICD-10-CM

## 2017-01-31 DIAGNOSIS — S0502XA Injury of conjunctiva and corneal abrasion without foreign body, left eye, initial encounter: Secondary | ICD-10-CM

## 2017-01-31 MED ORDER — POLYMYXIN B-TRIMETHOPRIM 10000-0.1 UNIT/ML-% OP SOLN: 1.0000 [drp] | OPHTHALMIC | 0 refills | Status: DC

## 2017-01-31 NOTE — ED Triage Notes (Signed)
C/o left eye swollen which started this morning States yesterday it felt as if something was in eye

## 2017-01-31 NOTE — Discharge Instructions (Signed)
Keep protected especially in the next couple of hours since it has been anesthetized. Utilize the medication as directed. When at the drugstore pickup the eyedrops called Zaditor and Place 1 drop in the left eye twice a day to help with swelling, redness and inflammation. Warm compresses to the left eye 3 or 4 times a day. For worsening, new symptoms or problems follow-up with ophthalmologist.
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
## 2017-01-31 NOTE — ED Notes (Signed)
Visual   Acuity     20/  20   l     Visual  Acuity   20/20      Right

## 2017-07-07 ENCOUNTER — Encounter (HOSPITAL_COMMUNITY): Payer: Self-pay | Admitting: *Deleted

## 2017-07-07 ENCOUNTER — Emergency Department (HOSPITAL_COMMUNITY)
Admission: EM | Admit: 2017-07-07 | Discharge: 2017-07-07 | Disposition: A | Discharge status: Home / Self Care | Payer: Self-pay | Attending: Emergency Medicine | Admitting: Emergency Medicine

## 2017-07-07 ENCOUNTER — Emergency Department (HOSPITAL_COMMUNITY): Payer: Self-pay

## 2017-07-07 DIAGNOSIS — Y998 Other external cause status: Secondary | ICD-10-CM | POA: Insufficient documentation

## 2017-07-07 DIAGNOSIS — F17200 Nicotine dependence, unspecified, uncomplicated: Secondary | ICD-10-CM | POA: Insufficient documentation

## 2017-07-07 DIAGNOSIS — Y92007 Garden or yard of unspecified non-institutional (private) residence as the place of occurrence of the external cause: Secondary | ICD-10-CM | POA: Insufficient documentation

## 2017-07-07 DIAGNOSIS — Z880 Allergy status to penicillin: Secondary | ICD-10-CM | POA: Insufficient documentation

## 2017-07-07 DIAGNOSIS — Y939 Activity, unspecified: Secondary | ICD-10-CM | POA: Insufficient documentation

## 2017-07-07 DIAGNOSIS — T07XXXA Unspecified multiple injuries, initial encounter: Secondary | ICD-10-CM | POA: Insufficient documentation

## 2017-07-07 DIAGNOSIS — J45909 Unspecified asthma, uncomplicated: Secondary | ICD-10-CM | POA: Insufficient documentation

## 2017-07-07 DIAGNOSIS — Z79899 Other long term (current) drug therapy: Secondary | ICD-10-CM | POA: Insufficient documentation

## 2017-07-07 DIAGNOSIS — Z23 Encounter for immunization: Secondary | ICD-10-CM | POA: Insufficient documentation

## 2017-07-07 MED ORDER — IBUPROFEN 800 MG PO TABS
800.0000 mg | ORAL_TABLET | Freq: Once | ORAL | Status: AC
  Administered 2017-07-07: 800 mg via ORAL
  Filled 2017-07-07: qty 1

## 2017-07-07 MED ORDER — TETANUS-DIPHTH-ACELL PERTUSSIS 5-2.5-18.5 LF-MCG/0.5 IM SUSP
0.5000 mL | Freq: Once | INTRAMUSCULAR | Status: AC
  Administered 2017-07-07: 0.5 mL via INTRAMUSCULAR
  Filled 2017-07-07: qty 0.5

## 2017-07-07 MED ORDER — CLINDAMYCIN HCL 300 MG PO CAPS: 300.0000 mg | ORAL_CAPSULE | Freq: Four times a day (QID) | ORAL | 0 refills | Status: DC

## 2017-07-07 MED ORDER — CLINDAMYCIN HCL 150 MG PO CAPS
300.0000 mg | ORAL_CAPSULE | Freq: Once | ORAL | Status: AC
  Administered 2017-07-07: 300 mg via ORAL
  Filled 2017-07-07: qty 2
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
## 2017-07-07 NOTE — ED Notes (Signed)
Patient transported to X-ray 
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
## 2017-07-07 NOTE — ED Notes (Signed)
Pt departed in NAD, refused use of wheelchair.  

## 2017-07-07 NOTE — ED Triage Notes (Signed)
The pt fell in the yard today he has 3 abrasions to his abd he wants to make sure its not infected

## 2020-05-07 ENCOUNTER — Ambulatory Visit (HOSPITAL_COMMUNITY)
Admission: EM | Admit: 2020-05-07 | Discharge: 2020-05-07 | Disposition: A | Discharge status: Home / Self Care | Payer: Managed Care, Other (non HMO) | Attending: Family Medicine | Admitting: Family Medicine

## 2020-05-07 ENCOUNTER — Other Ambulatory Visit: Payer: Self-pay

## 2020-05-07 ENCOUNTER — Encounter (HOSPITAL_COMMUNITY): Payer: Self-pay

## 2020-05-07 DIAGNOSIS — M5412 Radiculopathy, cervical region: Secondary | ICD-10-CM

## 2020-05-07 MED ORDER — TIZANIDINE HCL 4 MG PO TABS: 4.0000 mg | ORAL_TABLET | Freq: Four times a day (QID) | ORAL | 0 refills | Status: DC | PRN

## 2020-05-07 MED ORDER — METHYLPREDNISOLONE 4 MG PO TBPK: ORAL_TABLET | ORAL | 0 refills | Status: DC

## 2020-05-07 NOTE — ED Provider Notes (Signed)
[place]    CSN: [id number] Arrival date & time: 05/07/20  1255      History   Chief Complaint Chief Complaint  Patient presents with  . Arm Pain    HPI Seth Kim is a 39 y.o. male.   HPI   Patient states that he woke up 5 days ago with pain in his right neck.  He points to the upper body the trapezius, lower neck region.  He states that he has some stiffness and soreness in his neck.  He states it is radiating into his right hand and he has some tingling and numb sensation in the pad of his thumb, right side only.  Normal dexterity.  Normal strength. Never had any significant neck problems before, pinched nerves He denies any activity that may have caused this pain.  No fall, injury, trauma, overuse   Past Medical History:  Diagnosis Date  . Asthma   . Schizophrenia [facility])     Patient Active Problem List   Diagnosis Date Noted  . Elevated transaminase level   . AKI (acute kidney injury) ([place]) 01/24/2015  . Lactic acidosis 01/24/2015  . Leukocytosis 01/24/2015  . Abnormal transaminases 01/24/2015  . Altered mental status 01/23/2015  . Paranoid delusion ([place]) 01/23/2015    History reviewed. No pertinent surgical history.     Home Medications    Prior to Admission medications   Medication Sig Start Date End Date Taking? Authorizing Provider  ibuprofen (ADVIL) 400 MG tablet Take 400 mg by mouth every 6 (six) hours as needed.   Yes [provider]  methylPREDNISolone (MEDROL DOSEPAK) 4 MG TBPK tablet tad 05/07/20   Raylene Everts, MD  Naphazoline HCl (CLEAR EYES OP) Place 1 drop into both eyes daily as needed. For infected eyes    [provider]  risperiDONE (RISPERDAL M-TABS) 0.5 MG disintegrating tablet Take 1 tablet (0.5 mg total) by mouth 2 (two) times daily. 01/28/15   Oswald Hillock, MD  tiZANidine (ZANAFLEX) 4 MG tablet Take 1-2 tablets (4-8 mg total) by mouth every 6 (six) hours as needed for muscle spasms. 05/07/20    Raylene Everts, MD    Family History Family History  Problem Relation Age of Onset  . Asthma Father     Social History Social History   Tobacco Use  . Smoking status: Current Every Day Smoker  . Smokeless tobacco: Never Used  Substance Use Topics  . Alcohol use: Yes    Comment: Drinks heavily per mother   . Drug use: Yes    Types: Cocaine, Marijuana    Comment: Per labs      Allergies   Penicillins   Review of Systems Review of Systems  Musculoskeletal: Positive for neck pain and neck stiffness.  Neurological: Positive for numbness.     Physical Exam Triage Vital Signs ED Triage Vitals  Enc Vitals Group     BP 05/07/20 1317 (!) 143/95     Pulse Rate 05/07/20 1317 88     Resp 05/07/20 1317 16     Temp 05/07/20 1317 97.8 F (36.6 C)     Temp Source 05/07/20 1317 Oral     SpO2 05/07/20 1317 100 %     Weight --      Height --      Head Circumference --      Peak Flow --      Pain Score 05/07/20 1315 6     Pain Loc --  Pain Edu? --      Excl. in GC? --    No data found.  Updated Vital Signs BP (!) 143/95 (BP Location: Right Arm)   Pulse 88   Temp 97.8 F (36.6 C) (Oral)   Resp 16   SpO2 100%  Physical Exam Constitutional:      General: He is not in acute distress.    Appearance: He is well-developed and normal weight.  HENT:     Head: Normocephalic and atraumatic.     Mouth/Throat:     Comments: Mask is in place Eyes:     Conjunctiva/sclera: Conjunctivae normal.     Pupils: Pupils are equal, round, and reactive to light.  Cardiovascular:     Rate and Rhythm: Normal rate.  Pulmonary:     Effort: Pulmonary effort is normal. No respiratory distress.  Musculoskeletal:        General: Normal range of motion.     Cervical back: Normal range of motion.     Comments: Tenderness in the right upper body of the trapezius, at the right of C6-7.  No bony tenderness.  Limited range of motion.  Strength sensation range of motion reflexes normal in  both upper extremities.  Pad of right thumb has diminished sensation to touch but can feel sharp dull  Skin:    General: Skin is warm and dry.  Neurological:     Mental Status: He is alert.     Cranial Nerves: No cranial nerve deficit.     Motor: No weakness.     Gait: Gait normal.     Deep Tendon Reflexes: Reflexes normal.  Psychiatric:        Mood and Affect: Mood normal.        Behavior: Behavior normal.      UC Treatments / Results  Labs (all labs ordered are listed, but only abnormal results are displayed) Labs Reviewed - No data to display  EKG   Radiology No results found.  Procedures Procedures (including critical care time)  Medications Ordered in UC Medications - No data to display  Initial Impression / Assessment and Plan / UC Course  I have reviewed the triage vital signs and the nursing notes.  Pertinent labs & imaging results that were available during my care of the patient were reviewed by me and considered in my medical decision making (see chart for details).     Reviewed that he likely is having "pinched nerve" symptoms from the muscular neck pain.  X-rays not indicated.  We will treat symptomatically, return if fails to improve Final Clinical Impressions(s) / UC Diagnoses   Final diagnoses:  Cervical radiculitis     Discharge Instructions     Take the Medrol Dosepak as prescribed.  This is an anti-inflammatory medication to help with pain sharp symptoms. Take tizanidine as needed.  This is a mild muscle relaxer.  It is helpful at night  use ice or heat to painful muscles  gentle stretching every day  call or return if not improving by Tuesday       ED Prescriptions    Medication Sig Dispense Auth. Provider   methylPREDNISolone (MEDROL DOSEPAK) 4 MG TBPK tablet tad 21 tablet [name], MD   tiZANidine (ZANAFLEX) 4 MG tablet Take 1-2 tablets (4-8 mg total) by mouth every 6 (six) hours as needed for muscle spasms. 21 tablet  [name], MD     PDMP not reviewed this encounter.   [name],  MD 05/07/20 1406

## 2020-05-07 NOTE — Discharge Instructions (Addendum)
Take the Medrol Dosepak as prescribed.  This is an anti-inflammatory medication to help with pain sharp symptoms. Take tizanidine as needed.  This is a mild muscle relaxer.  It is helpful at night  use ice or heat to painful muscles  gentle stretching every day  call or return if not improving by Tuesday

## 2020-05-07 NOTE — ED Triage Notes (Signed)
Pt c/o right arm/shoulder/neck pain/stiffness upon waking approx 3 days ago. C/o mild numbness to right thumb.  Denies known trauma/injury to area, CP or SOB.

## 2020-05-11 ENCOUNTER — Ambulatory Visit (HOSPITAL_COMMUNITY)
Admission: EM | Admit: 2020-05-11 | Discharge: 2020-05-11 | Disposition: A | Discharge status: Home / Self Care | Payer: Managed Care, Other (non HMO) | Attending: Physician Assistant | Admitting: Physician Assistant

## 2020-05-11 ENCOUNTER — Other Ambulatory Visit: Payer: Self-pay

## 2020-05-11 ENCOUNTER — Encounter (HOSPITAL_COMMUNITY): Payer: Self-pay

## 2020-05-11 DIAGNOSIS — M5412 Radiculopathy, cervical region: Secondary | ICD-10-CM | POA: Diagnosis not present

## 2020-05-11 MED ORDER — NAPROXEN 500 MG PO TABS: 500.0000 mg | ORAL_TABLET | Freq: Two times a day (BID) | ORAL | 0 refills | Status: DC

## 2020-05-11 MED ORDER — TIZANIDINE HCL 4 MG PO TABS: 4.0000 mg | ORAL_TABLET | Freq: Three times a day (TID) | ORAL | 0 refills | Status: DC | PRN

## 2020-05-11 NOTE — Discharge Instructions (Addendum)
Complete your treatments from a previous visit When you have completed the Medrol Dosepak you may start taking naproxen twice a day You may take the muscle relaxer as prescribed, I recommend primarily at night as this will make you sleepy.  Do not drive or drink alcohol after taking this  I feel you be best followed up by the sports medicine group for your neck symptoms specifically.  Call them as needed  Please establish care with the [facility] health internal medicine center for primary care  Discussed reasons to return to urgent care or report to the emergency department, these are if you have complete loss of use of the right arm, severe neck pain or complete numbness of

## 2020-05-11 NOTE — ED Provider Notes (Signed)
[facility]    CSN: [id number] Arrival date & time: 05/11/20  1359      History   Chief Complaint Chief Complaint  Patient presents with  . Follow-up    HPI Seth Kim is a 39 y.o. male.   Patient returns for follow-up visit on 05/07/2020 for cervical radiculitis.  He reports he still has some diminished sensation in the right thumb.  He reports overall his pain is improved.  He reports he is taking the last dose of steroid today.  He has been using the muscle relaxer as prescribed.  He reports overall he has improved significantly.     Past Medical History:  Diagnosis Date  . Asthma   . Bipolar disorder, unspecified (HCC) 2015  . Schizophrenia [facility])     Patient Active Problem List   Diagnosis Date Noted  . Elevated transaminase level   . AKI (acute kidney injury) (HCC) 01/24/2015  . Lactic acidosis 01/24/2015  . Leukocytosis 01/24/2015  . Abnormal transaminases 01/24/2015  . Altered mental status 01/23/2015  . Paranoid delusion (HCC) 01/23/2015    Past Surgical History:  Procedure Laterality Date  . NO PAST SURGERIES         Home Medications    Prior to Admission medications   Medication Sig Start Date End Date Taking? Authorizing Provider  ibuprofen (ADVIL) 400 MG tablet Take 400 mg by mouth every 6 (six) hours as needed.    [provider]  methylPREDNISolone (MEDROL DOSEPAK) 4 MG TBPK tablet tad 05/07/20   [name], MD  Naphazoline HCl (CLEAR EYES OP) Place 1 drop into both eyes daily as needed. For infected eyes    [provider]  naproxen (NAPROSYN) 500 MG tablet Take 1 tablet (500 mg total) by mouth 2 (two) times daily. 05/11/20   [name], Veryl Speak, PA-C  risperiDONE (RISPERDAL M-TABS) 0.5 MG disintegrating tablet Take 1 tablet (0.5 mg total) by mouth 2 (two) times daily. 01/28/15   [name], MD  tiZANidine (ZANAFLEX) 4 MG tablet Take 1-2 tablets (4-8 mg total) by mouth every 8 (eight) hours as needed for muscle  spasms. 05/11/20   [name], Veryl Speak, PA-C    Family History Family History  Problem Relation Age of Onset  . Asthma Father     Social History Social History   Tobacco Use  . Smoking status: Current Some Day Smoker  . Smokeless tobacco: Never Used  Substance Use Topics  . Alcohol use: Yes    Comment: Drinks heavily per mother   . Drug use: Yes    Types: Cocaine, Marijuana    Comment: Per labs      Allergies   Penicillins   Review of Systems Review of Systems   Physical Exam Triage Vital Signs ED Triage Vitals  Enc Vitals Group     BP 05/11/20 1457 124/81     Pulse Rate 05/11/20 1457 67     Resp 05/11/20 1457 14     Temp 05/11/20 1457 98.1 F (36.7 C)     Temp Source 05/11/20 1457 Oral     SpO2 05/11/20 1457 98 %     Weight --      Height --      Head Circumference --      Peak Flow --      Pain Score 05/11/20 1454 0     Pain Loc --      Pain Edu? --      Excl. in  GC? --    No data found.  Updated Vital Signs BP 124/81 (BP Location: Right Arm)   Pulse 67   Temp 98.1 F (36.7 C) (Oral)   Resp 14   SpO2 98%   Visual Acuity Right Eye Distance:   Left Eye Distance:   Bilateral Distance:    Right Eye Near:   Left Eye Near:    Bilateral Near:     Physical Exam Vitals and nursing note reviewed.  Constitutional:      Appearance: He is well-developed.  HENT:     Head: Normocephalic and atraumatic.  Eyes:     Conjunctiva/sclera: Conjunctivae normal.  Cardiovascular:     Rate and Rhythm: Normal rate and regular rhythm.     Heart sounds: No murmur.  Pulmonary:     Effort: Pulmonary effort is normal. No respiratory distress.     Breath sounds: Normal breath sounds.  Abdominal:     Palpations: Abdomen is soft.     Tenderness: There is no abdominal tenderness.  Musculoskeletal:     Cervical back: Normal range of motion and neck supple. Tenderness (Mild right-sided paraspinal tenderness.  Minimal to no midline tenderness) present. No rigidity.      Comments: Patient has full range of motion of upper extremities.  Patient does report some decreased pain in his neck with right arm overhead.  5/5 strength in the upper extremities to include grip and finger abduction, thumb opposition. Sign mildly diminished sensation of the right thumb versus left.  Skin:    General: Skin is warm and dry.     Capillary Refill: Capillary refill takes less than 2 seconds.  Neurological:     Mental Status: He is alert.      UC Treatments / Results  Labs (all labs ordered are listed, but only abnormal results are displayed) Labs Reviewed - No data to display  EKG   Radiology No results found.  Procedures Procedures (including critical care time)  Medications Ordered in UC Medications - No data to display  Initial Impression / Assessment and Plan / UC Course  I have reviewed the triage vital signs and the nursing notes.  Pertinent labs & imaging results that were available during my care of the patient were reviewed by me and considered in my medical decision making (see chart for details).     #Cervical radiculitis Patient is a 39 year old with symptoms consistent with a cervical radiculitis.  He is currently completing his Solu-Medrol pack and has been using the muscle relaxer.  We will give continued course of muscle relaxer and recommend starting NSAID therapy when completion of steroid.  We will have him follow-up with sports medicine, as he does not have a primary care currently and should have follow-up for this.  Also recommend establishing care with the [facility] health internal medicine center.  Both resources were given.  Emergency department precautions were given.  Patient verbalized understanding Final Clinical Impressions(s) / UC Diagnoses   Final diagnoses:  Cervical radiculitis     Discharge Instructions     Complete your treatments from a previous visit When you have completed the Medrol Dosepak you may start taking  naproxen twice a day You may take the muscle relaxer as prescribed, I recommend primarily at night as this will make you sleepy.  Do not drive or drink alcohol after taking this  I feel you be best followed up by the sports medicine group for your neck symptoms specifically.  Call them as needed  Please establish care with the [facility] health internal medicine center for primary care  Discussed reasons to return to urgent care or report to the emergency department, these are if you have complete loss of use of the right arm, severe neck pain or complete numbness of      ED Prescriptions    Medication Sig Dispense Auth. Provider   tiZANidine (ZANAFLEX) 4 MG tablet Take 1-2 tablets (4-8 mg total) by mouth every 8 (eight) hours as needed for muscle spasms. 14 tablet Reannon Candella, Marguerita Beards, PA-C   naproxen (NAPROSYN) 500 MG tablet Take 1 tablet (500 mg total) by mouth 2 (two) times daily. 30 tablet Kameah Rawl, Marguerita Beards, PA-C     PDMP not reviewed this encounter.   Purnell Shoemaker, PA-C 05/11/20 2343

## 2020-05-11 NOTE — ED Triage Notes (Signed)
Patient here to follow up about tingling in right thumb. Reports that the pain has gone away, but he is still having that numb sensation in the thumb.

## 2020-06-11 ENCOUNTER — Emergency Department (HOSPITAL_COMMUNITY): Payer: Managed Care, Other (non HMO)

## 2020-06-11 ENCOUNTER — Emergency Department (HOSPITAL_COMMUNITY)
Admission: EM | Admit: 2020-06-11 | Discharge: 2020-06-12 | Disposition: A | Discharge status: Home / Self Care | Payer: Managed Care, Other (non HMO) | Attending: Emergency Medicine | Admitting: Emergency Medicine

## 2020-06-11 ENCOUNTER — Encounter (HOSPITAL_COMMUNITY): Payer: Self-pay | Admitting: Emergency Medicine

## 2020-06-11 ENCOUNTER — Other Ambulatory Visit: Payer: Self-pay

## 2020-06-11 ENCOUNTER — Ambulatory Visit (HOSPITAL_COMMUNITY)
Admission: EM | Admit: 2020-06-11 | Discharge: 2020-06-11 | Disposition: A | Discharge status: Other Acute Inpatient Hospital | Payer: Managed Care, Other (non HMO) | Source: Home / Self Care | Attending: Family Medicine | Admitting: Family Medicine

## 2020-06-11 DIAGNOSIS — Y939 Activity, unspecified: Secondary | ICD-10-CM | POA: Insufficient documentation

## 2020-06-11 DIAGNOSIS — J45909 Unspecified asthma, uncomplicated: Secondary | ICD-10-CM | POA: Insufficient documentation

## 2020-06-11 DIAGNOSIS — S51812A Laceration without foreign body of left forearm, initial encounter: Secondary | ICD-10-CM | POA: Insufficient documentation

## 2020-06-11 DIAGNOSIS — S59912A Unspecified injury of left forearm, initial encounter: Secondary | ICD-10-CM | POA: Diagnosis present

## 2020-06-11 DIAGNOSIS — Y929 Unspecified place or not applicable: Secondary | ICD-10-CM | POA: Diagnosis not present

## 2020-06-11 DIAGNOSIS — Y999 Unspecified external cause status: Secondary | ICD-10-CM | POA: Insufficient documentation

## 2020-06-11 DIAGNOSIS — F17200 Nicotine dependence, unspecified, uncomplicated: Secondary | ICD-10-CM | POA: Diagnosis not present
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
## 2020-06-11 NOTE — ED Notes (Signed)
I applied dry dressing to pt's wound on left arm.

## 2020-06-12 MED ORDER — IBUPROFEN 200 MG PO TABS
600.0000 mg | ORAL_TABLET | Freq: Once | ORAL | Status: AC
  Administered 2020-06-12: 600 mg via ORAL
  Filled 2020-06-12: qty 1

## 2020-06-12 MED ORDER — LIDOCAINE-EPINEPHRINE (PF) 2 %-1:200000 IJ SOLN
10.0000 mL | Freq: Once | INTRAMUSCULAR | Status: AC
  Administered 2020-06-12: 10 mL
  Filled 2020-06-12: qty 20
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
## 2020-06-12 NOTE — Discharge Instructions (Signed)
Thank you for allowing me to care for you today in the Emergency Department.   Keep the wound clean and dry for the next 24 hours.  Then, you can gently clean the area with warm water and soap.  You can apply a topical antibiotic such as bacitracin or Neosporin to the area.  You can cover the area with a bandage.  Keeping a sunlight off of the wound will also help to minimize scarring.  Your stitches need to be removed in 7 days.  You can follow-up with primary care, return to the emergency department, or go to urgent care to have them removed.  For the next few months, you should make sure to apply sunscreen to the area.  Take 650 mg of Tylenol or 600 mg of ibuprofen with food every 6 hours for pain.  You can alternate between these 2 medications every 3 hours if your pain returns.  For instance, you can take Tylenol at noon, followed by a dose of ibuprofen at 3, followed by second dose of Tylenol and 6.  You should return to the emergency department if you develop fever, chills, if the forearm gets very swollen, hot to the touch, red, or if you start to have thick, mucus-like drainage from the wound.

## 2020-06-12 NOTE — ED Notes (Signed)
Patient verbalizes understanding of discharge instructions. Opportunity for questioning and answers were provided. Armband removed by staff, pt discharged from ED. Pt. ambulatory and discharged home.  

## 2020-06-20 ENCOUNTER — Other Ambulatory Visit: Payer: Self-pay

## 2020-06-20 ENCOUNTER — Ambulatory Visit (HOSPITAL_COMMUNITY)
Admission: EM | Admit: 2020-06-20 | Discharge: 2020-06-20 | Disposition: A | Discharge status: Home / Self Care | Payer: Managed Care, Other (non HMO)

## 2020-06-20 NOTE — ED Notes (Signed)
Four sutures removed (2 mattress and 2 simple) from left forearm. Wound healing well; bandage applied.

## 2020-08-23 IMAGING — CR DG WRIST COMPLETE 3+V*L*
4 series · 4 of 4 positions shown · non-contrast
Comparison: None.

CLINICAL DATA: Laceration

EXAM:
LEFT WRIST - COMPLETE 3+ VIEW

[wrist pa]
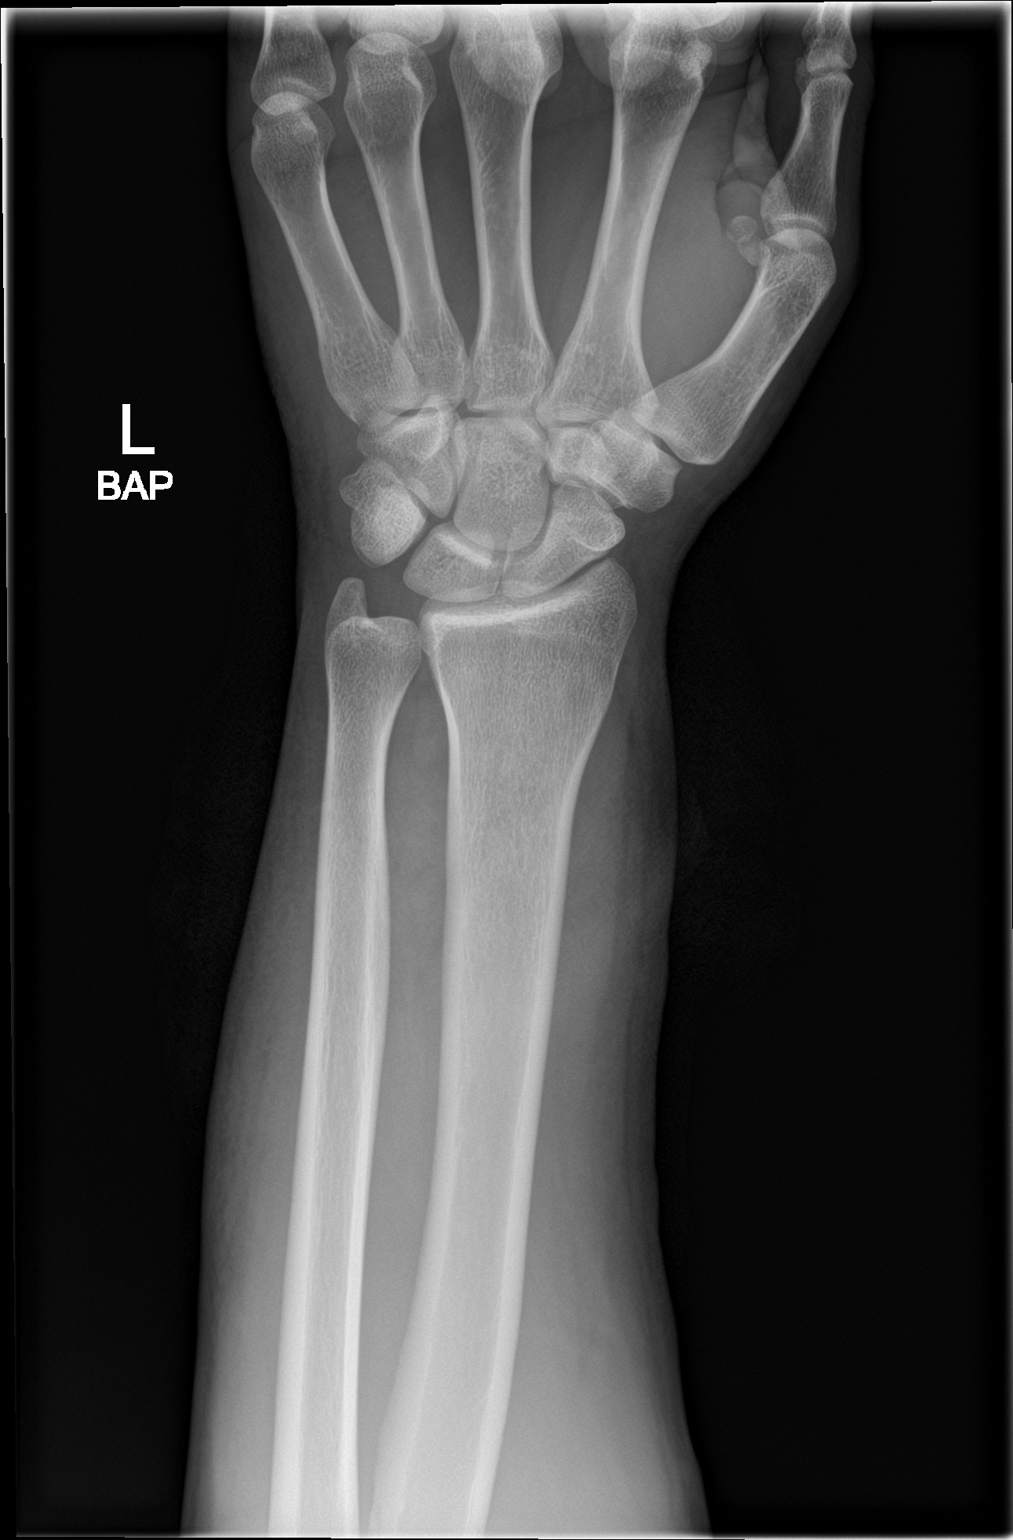

[wrist obl]
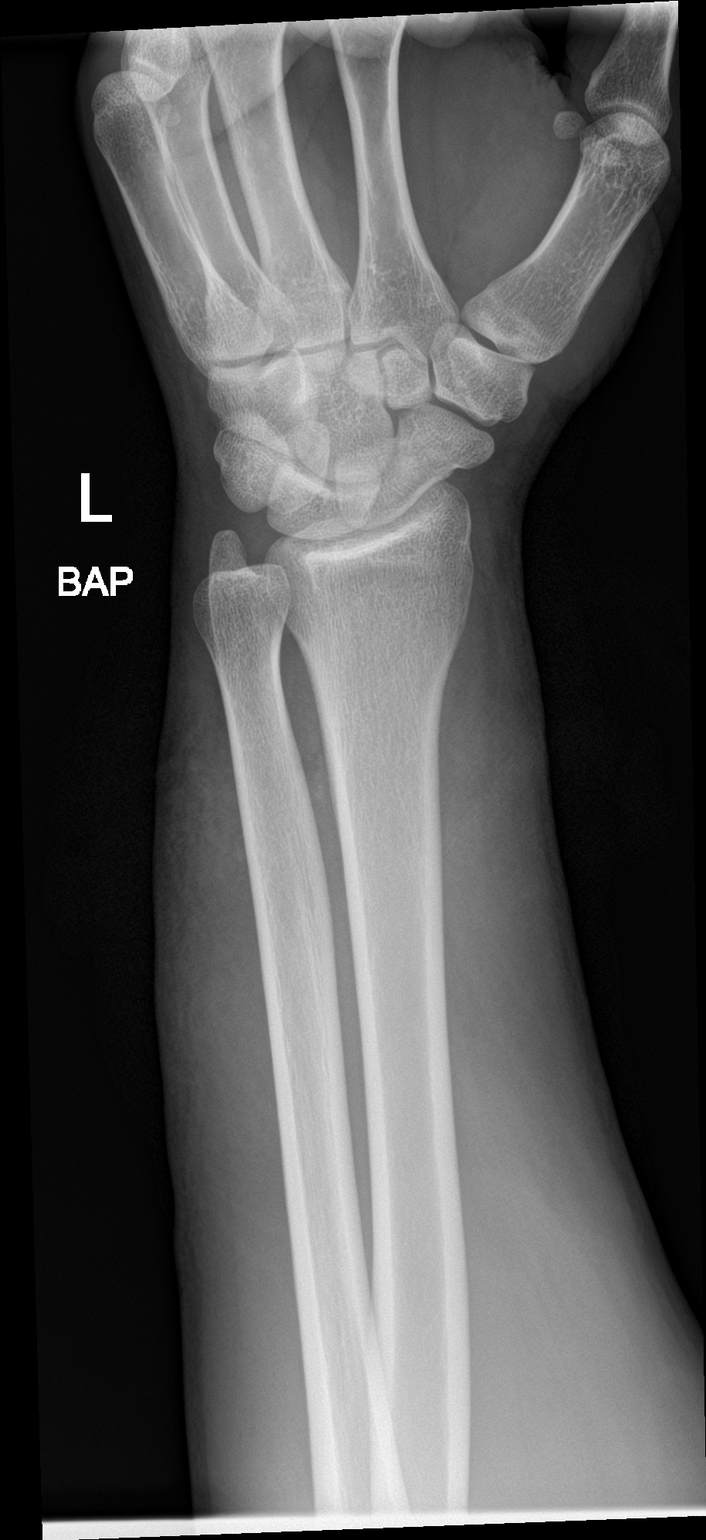

[wrist lat]
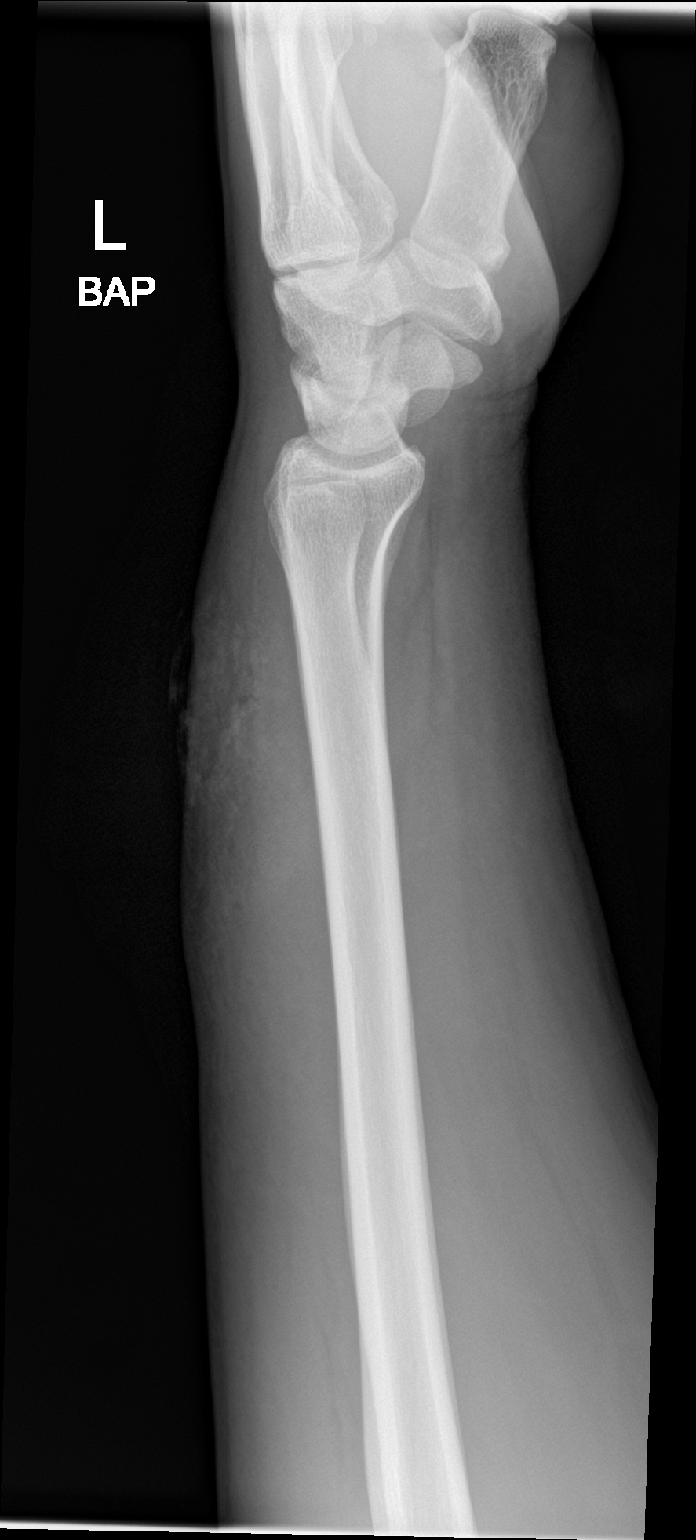

[wrist navicular]
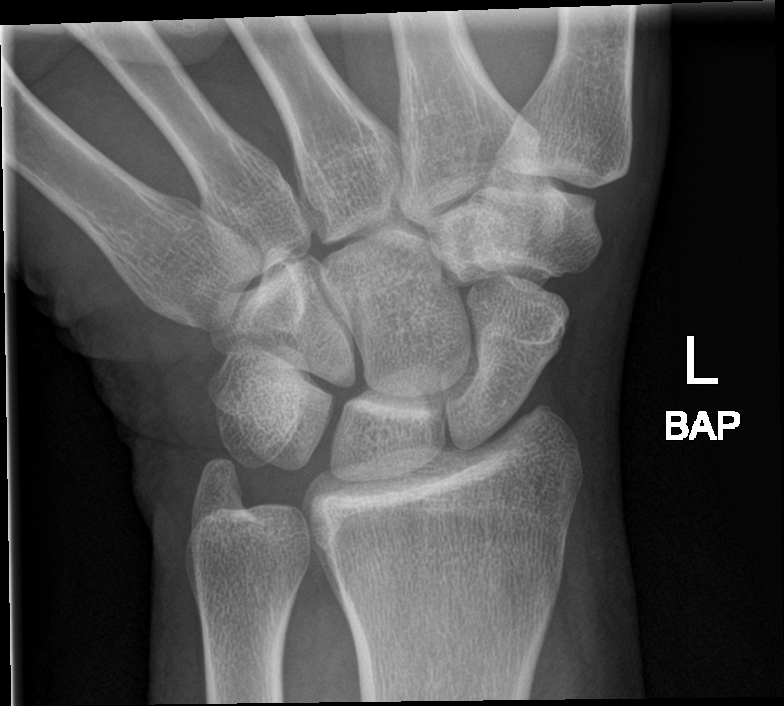

[4 of 4 positions shown; findings below may reference images not displayed]

FINDINGS: There is no evidence of fracture or dislocation. There is no
evidence of arthropathy or other focal bone abnormality. Laceration
dorsal distal forearm. No radiopaque foreign body.
IMPRESSION: No acute osseous abnormality.

## 2022-01-09 ENCOUNTER — Encounter (HOSPITAL_COMMUNITY): Payer: Self-pay

## 2022-01-09 ENCOUNTER — Other Ambulatory Visit: Payer: Self-pay

## 2022-01-09 ENCOUNTER — Ambulatory Visit (HOSPITAL_COMMUNITY)
Admission: EM | Admit: 2022-01-09 | Discharge: 2022-01-09 | Disposition: A | Discharge status: Home / Self Care | Payer: 59 | Attending: Physician Assistant | Admitting: Physician Assistant

## 2022-01-09 DIAGNOSIS — K0889 Other specified disorders of teeth and supporting structures: Secondary | ICD-10-CM

## 2022-01-09 DIAGNOSIS — K047 Periapical abscess without sinus: Secondary | ICD-10-CM

## 2022-01-09 MED ORDER — CLINDAMYCIN HCL 300 MG PO CAPS: 300.0000 mg | ORAL_CAPSULE | Freq: Three times a day (TID) | ORAL | 0 refills | Status: DC

## 2022-01-09 NOTE — ED Triage Notes (Signed)
Pt presents for right side dental pain. This pain started yesterday.

## 2022-01-09 NOTE — Discharge Instructions (Signed)
Take clindamycin 3 times a day.  This can upset your stomach so take it with food.  Use Tylenol ibuprofen for pain relief.  I do recommend that he follow-up with a dentist and please call to schedule an appointment.  If you have any difficulty speaking, difficulty swallowing, swelling of your throat, shortness of breath, muffled voice you need to go to the emergency room.

## 2022-01-09 NOTE — ED Provider Notes (Signed)
[facility]    CSN: [id number] Arrival date & time: 01/09/22  1520      History   Chief Complaint Chief Complaint  Patient presents with   Dental Pain    HPI Seth Kim is a 41 y.o. male.   Patient presents today with a 2-day history of enlarging swelling of his right jaw with associated dentalgia.  Pain is rated 9 on a 0-10 pain scale, localized to right lower molar region, described as throbbing, no aggravating relieving factors identified.  He does not see dentist regularly denies any recent dental procedures.  He is able to eat and drink but has been subsisting on primarily soft foods and using the left side of his mouth for mastication.  He denies any difficulty speaking, difficulty swallowing, shortness of breath, swelling of his throat.  Denies any fever, nausea, vomiting.  He has been using Tylenol and ibuprofen without improvement of symptoms.   Past Medical History:  Diagnosis Date   Asthma    Bipolar disorder, unspecified (HCC) 2015   Schizophrenia [facility])     Patient Active Problem List   Diagnosis Date Noted   Elevated transaminase level    AKI (acute kidney injury) (HCC) 01/24/2015   Lactic acidosis 01/24/2015   Leukocytosis 01/24/2015   Abnormal transaminases 01/24/2015   Altered mental status 01/23/2015   Paranoid delusion (HCC) 01/23/2015    Past Surgical History:  Procedure Laterality Date   NO PAST SURGERIES         Home Medications    Prior to Admission medications   Medication Sig Start Date End Date Taking? Authorizing Provider  clindamycin (CLEOCIN) 300 MG capsule Take 1 capsule (300 mg total) by mouth 3 (three) times daily. 01/09/22   Krisa Blattner, Denny Peon K, PA-C  risperiDONE (RISPERDAL M-TABS) 0.5 MG disintegrating tablet Take 1 tablet (0.5 mg total) by mouth 2 (two) times daily. Patient not taking: Reported on 06/12/2020 01/28/15 06/12/20  [name], MD    Family History Family History  Problem Relation Age of Onset   Asthma  Father     Social History Social History   Tobacco Use   Smoking status: Some Days   Smokeless tobacco: Never  Vaping Use   Vaping Use: Never used  Substance Use Topics   Alcohol use: Yes    Comment: Drinks heavily per mother    Drug use: Yes    Types: Cocaine, Marijuana    Comment: Per labs      Allergies   Penicillins   Review of Systems Review of Systems  Constitutional:  Positive for activity change. Negative for appetite change, fatigue and fever.  HENT:  Positive for dental problem and facial swelling. Negative for congestion, sinus pressure, sneezing and sore throat.   Respiratory:  Negative for cough and shortness of breath.   Cardiovascular:  Negative for chest pain.  Gastrointestinal:  Negative for abdominal pain, diarrhea, nausea and vomiting.  Neurological:  Negative for dizziness, light-headedness and headaches.    Physical Exam Triage Vital Signs ED Triage Vitals  Enc Vitals Group     BP 01/09/22 1706 (!) 158/112     Pulse Rate 01/09/22 1706 87     Resp 01/09/22 1706 16     Temp 01/09/22 1706 98.2 F (36.8 C)     Temp Source 01/09/22 1706 Oral     SpO2 01/09/22 1706 99 %     Weight --      Height --  Head Circumference --      Peak Flow --      Pain Score 01/09/22 1707 10     Pain Loc --      Pain Edu? --      Excl. in GC? --    No data found.  Updated Vital Signs BP (!) 158/112 (BP Location: Left Arm)    Pulse 87    Temp 98.2 F (36.8 C) (Oral)    Resp 16    SpO2 99%   Visual Acuity Right Eye Distance:   Left Eye Distance:   Bilateral Distance:    Right Eye Near:   Left Eye Near:    Bilateral Near:     Physical Exam Vitals reviewed.  Constitutional:      General: He is awake.     Appearance: Normal appearance. He is well-developed. He is not ill-appearing.     Comments: Very pleasant male appears stated age in no acute distress sitting comfortably in exam room  HENT:     Head: Normocephalic and atraumatic.      Right  Ear: Tympanic membrane, ear canal and external ear normal. Tympanic membrane is not erythematous or bulging.     Left Ear: Tympanic membrane, ear canal and external ear normal. Tympanic membrane is not erythematous or bulging.     Nose: Nose normal.     Mouth/Throat:     Dentition: Abnormal dentition. Dental tenderness, gingival swelling and dental abscesses present. No gum lesions.     Pharynx: Uvula midline. No oropharyngeal exudate, posterior oropharyngeal erythema or uvula swelling.      Comments: No evidence of Ludwig angina Cardiovascular:     Rate and Rhythm: Normal rate and regular rhythm.     Heart sounds: Normal heart sounds, S1 normal and S2 normal. No murmur heard. Pulmonary:     Effort: Pulmonary effort is normal. No accessory muscle usage or respiratory distress.     Breath sounds: Normal breath sounds. No stridor. No wheezing, rhonchi or rales.     Comments: Clear to auscultation bilaterally Neurological:     Mental Status: He is alert.  Psychiatric:        Behavior: Behavior is cooperative.     UC Treatments / Results  Labs (all labs ordered are listed, but only abnormal results are displayed) Labs Reviewed - No data to display  EKG   Radiology No results found.  Procedures Procedures (including critical care time)  Medications Ordered in UC Medications - No data to display  Initial Impression / Assessment and Plan / UC Course  I have reviewed the triage vital signs and the nursing notes.  Pertinent labs & imaging results that were available during my care of the patient were reviewed by me and considered in my medical decision making (see chart for details).     Patient was started on clindamycin 3 times a day given allergy to penicillin.  Recommend he gargle with warm salt water and use over-the-counter medications including Tylenol/ibuprofen for pain relief.  Recommended he follow-up with a dentist as he would likely have recurrent symptoms until  underlying tooth is addressed and he is given contact information for low-cost dental providers in the area.  Discussed that if he has any worsening symptoms including difficulty speaking, difficulty swallowing, swelling of throat, shortness of breath he needs to go to the emergency room.  Strict return precautions given to which he expressed understanding.  Work excuse note provided.  Final Clinical Impressions(s) / UC Diagnoses  Final diagnoses:  Dental infection  Pain, dental     Discharge Instructions      Take clindamycin 3 times a day.  This can upset your stomach so take it with food.  Use Tylenol ibuprofen for pain relief.  I do recommend that he follow-up with a dentist and please call to schedule an appointment.  If you have any difficulty speaking, difficulty swallowing, swelling of your throat, shortness of breath, muffled voice you need to go to the emergency room.     ED Prescriptions     Medication Sig Dispense Auth. Provider   clindamycin (CLEOCIN) 300 MG capsule  (Status: Discontinued) Take 1 capsule (300 mg total) by mouth 3 (three) times daily. 30 capsule Timiyah Romito K, PA-C   clindamycin (CLEOCIN) 300 MG capsule Take 1 capsule (300 mg total) by mouth 3 (three) times daily. 30 capsule Ewald Beg K, PA-C      PDMP not reviewed this encounter.   Jeani Hawking, PA-C 01/09/22 1724

## 2022-01-19 ENCOUNTER — Ambulatory Visit (HOSPITAL_COMMUNITY)
Admission: EM | Admit: 2022-01-19 | Discharge: 2022-01-19 | Disposition: A | Discharge status: Home / Self Care | Payer: 59 | Attending: Physician Assistant | Admitting: Physician Assistant

## 2022-01-19 ENCOUNTER — Other Ambulatory Visit: Payer: Self-pay

## 2022-01-19 ENCOUNTER — Ambulatory Visit (INDEPENDENT_AMBULATORY_CARE_PROVIDER_SITE_OTHER): Payer: 59

## 2022-01-19 ENCOUNTER — Encounter (HOSPITAL_COMMUNITY): Payer: Self-pay

## 2022-01-19 DIAGNOSIS — M25552 Pain in left hip: Secondary | ICD-10-CM

## 2022-01-19 DIAGNOSIS — M25559 Pain in unspecified hip: Secondary | ICD-10-CM

## 2022-01-19 MED ORDER — TIZANIDINE HCL 4 MG PO TABS: 4.0000 mg | ORAL_TABLET | Freq: Two times a day (BID) | ORAL | 0 refills | Status: DC | PRN

## 2022-01-19 MED ORDER — NAPROXEN 500 MG PO TABS: 500.0000 mg | ORAL_TABLET | Freq: Two times a day (BID) | ORAL | 0 refills | Status: DC

## 2022-01-19 NOTE — ED Triage Notes (Signed)
Pt reports L side of hip hurting.  States he feels his leg one hit his hip bone too hard.   States when he walks he gets a tingling feeling.

## 2022-01-19 NOTE — ED Provider Notes (Signed)
[facility]    CSN: [id number] Arrival date & time: 01/19/22  1443      History   Chief Complaint Chief Complaint  Patient presents with   Hip Pain    HPI Seth Kim is a 41 y.o. male.   Patient presents today with a 1.5-week history of worsening left hip pain.  He reports that he went to jump out of a forklift when he misjudged the distance causing him to land on an extended and braced left leg.  He has had ongoing pain in his left hip since that time.  Pain is worse with certain movements including extension and he will have a numbness and associated paresthesia that runs into upper leg with certain movements.  He has not tried any over-the-counter medication.  Denies any history of injury or surgery.  He is able to ambulate but it is difficult.   Past Medical History:  Diagnosis Date   Asthma    Bipolar disorder, unspecified (HCC) 2015   Schizophrenia [facility])     Patient Active Problem List   Diagnosis Date Noted   Elevated transaminase level    AKI (acute kidney injury) (HCC) 01/24/2015   Lactic acidosis 01/24/2015   Leukocytosis 01/24/2015   Abnormal transaminases 01/24/2015   Altered mental status 01/23/2015   Paranoid delusion (HCC) 01/23/2015    Past Surgical History:  Procedure Laterality Date   NO PAST SURGERIES         Home Medications    Prior to Admission medications   Medication Sig Start Date End Date Taking? Authorizing Provider  naproxen (NAPROSYN) 500 MG tablet Take 1 tablet (500 mg total) by mouth 2 (two) times daily with a meal. 01/19/22  Yes Kellye Mizner K, PA-C  tiZANidine (ZANAFLEX) 4 MG tablet Take 1 tablet (4 mg total) by mouth 2 (two) times daily as needed for muscle spasms. 01/19/22  Yes Timberly Yott K, PA-C  risperiDONE (RISPERDAL M-TABS) 0.5 MG disintegrating tablet Take 1 tablet (0.5 mg total) by mouth 2 (two) times daily. Patient not taking: Reported on 06/12/2020 01/28/15 06/12/20  [name], MD    Family  History Family History  Problem Relation Age of Onset   Asthma Father     Social History Social History   Tobacco Use   Smoking status: Some Days   Smokeless tobacco: Never  Vaping Use   Vaping Use: Never used  Substance Use Topics   Alcohol use: Yes    Comment: Drinks heavily per mother    Drug use: Yes    Types: Cocaine, Marijuana    Comment: Per labs      Allergies   Penicillins   Review of Systems Review of Systems  Constitutional:  Positive for activity change. Negative for appetite change, fatigue and fever.  Respiratory:  Negative for cough and shortness of breath.   Cardiovascular:  Negative for chest pain.  Gastrointestinal:  Negative for abdominal pain, diarrhea, nausea and vomiting.  Musculoskeletal:  Positive for arthralgias and gait problem. Negative for back pain, joint swelling and myalgias.  Neurological:  Negative for dizziness, weakness, light-headedness, numbness and headaches.    Physical Exam Triage Vital Signs ED Triage Vitals  Enc Vitals Group     BP 01/19/22 1019 (!) 131/94     Pulse Rate 01/19/22 1017 95     Resp 01/19/22 1017 17     Temp 01/19/22 1017 98.3 F (36.8 C)     Temp Source 01/19/22 1017 Oral  SpO2 01/19/22 1017 99 %     Weight --      Height --      Head Circumference --      Peak Flow --      Pain Score 01/19/22 1016 5     Pain Loc --      Pain Edu? --      Excl. in GC? --    No data found.  Updated Vital Signs BP (!) 131/94    Pulse 95    Temp 98.3 F (36.8 C) (Oral)    Resp 17    SpO2 99%   Visual Acuity Right Eye Distance:   Left Eye Distance:   Bilateral Distance:    Right Eye Near:   Left Eye Near:    Bilateral Near:     Physical Exam Vitals reviewed.  Constitutional:      General: He is awake.     Appearance: Normal appearance. He is well-developed. He is not ill-appearing.     Comments: Very pleasant male appears stated age in no acute distress sitting comfortably in exam room  HENT:      Head: Normocephalic and atraumatic.  Cardiovascular:     Rate and Rhythm: Normal rate and regular rhythm.     Heart sounds: Normal heart sounds, S1 normal and S2 normal. No murmur heard. Pulmonary:     Effort: Pulmonary effort is normal.     Breath sounds: Normal breath sounds. No stridor. No wheezing, rhonchi or rales.     Comments: Clear auscultation bilaterally Musculoskeletal:     Left hip: Tenderness and bony tenderness present. No deformity. Decreased range of motion. Normal strength.     Comments: Left hip: Decreased in motion with flexion and abduction.  Tenderness palpation over anterior joint.  Strength 5/5.  Neurological:     Mental Status: He is alert.  Psychiatric:        Behavior: Behavior is cooperative.     UC Treatments / Results  Labs (all labs ordered are listed, but only abnormal results are displayed) Labs Reviewed - No data to display  EKG   Radiology DG Hip Unilat With Pelvis 2-3 Views Left  Result Date: 01/19/2022 CLINICAL DATA:  Left hip pain. EXAM: DG HIP (WITH OR WITHOUT PELVIS) 2-3V LEFT COMPARISON:  None. FINDINGS: There is no evidence of hip fracture or dislocation. There is no evidence of arthropathy or other focal bone abnormality. IMPRESSION: Negative. Electronically Signed   By: Lupita Raider M.D.   On: 01/19/2022 12:04    Procedures Procedures (including critical care time)  Medications Ordered in UC Medications - No data to display  Initial Impression / Assessment and Plan / UC Course  I have reviewed the triage vital signs and the nursing notes.  Pertinent labs & imaging results that were available during my care of the patient were reviewed by me and considered in my medical decision making (see chart for details).     X-ray obtained given mechanism of injury showed no osseous abnormality.  Suspect muscle strain as etiology of symptoms.  Patient was started on Naprosyn with instruction not to take additional NSAIDs with this  medication due to risk of GI bleeding.  Can use Tylenol, heat, rest, stretch for symptom relief.  He was also prescribed Zanaflex which he can use up to twice a day.  Discussed that this is sedating and he should not drive or drink alcohol while taking it.  If symptoms not improving quickly with  conservative treatment options recommend he follow-up with orthopedics and was given contact information for local provider.  Discussed that if anything worsens and he has severe pain, difficulty walking, numbness or tingling in his leg he should be seen immediately.  Strict return precautions given to which he expressed understanding.  Work excuse note provided.  Final Clinical Impressions(s) / UC Diagnoses   Final diagnoses:  Hip pain  Left hip pain     Discharge Instructions      Your x-ray was normal with no evidence of fracture which is great news.  I believe that you have a muscle strain which is contributing to your symptoms.  Please take Naprosyn twice daily.  You should not take NSAIDs with this medication including aspirin, ibuprofen/Advil, naproxen/Aleve as it can cause stomach bleeding.  You can use Tylenol for breakthrough pain.  Use heat and gentle stretch for symptom relief.  You can also use Zanaflex which is a muscle relaxer up to twice a day.  This will make you sleepy so do not drive or drink alcohol while taking it.  If your symptoms or not improving quickly please follow-up with orthopedics.  If at any point anything worsens and you have severe pain, difficulty walking, numbness or tingling in the leg you should be seen immediately.     ED Prescriptions     Medication Sig Dispense Auth. Provider   naproxen (NAPROSYN) 500 MG tablet Take 1 tablet (500 mg total) by mouth 2 (two) times daily with a meal. 20 tablet Willy Pinkerton K, PA-C   tiZANidine (ZANAFLEX) 4 MG tablet Take 1 tablet (4 mg total) by mouth 2 (two) times daily as needed for muscle spasms. 10 tablet Mikal Blasdell, Noberto Retort, PA-C       PDMP not reviewed this encounter.   Jeani Hawking, PA-C 01/19/22 1218

## 2022-01-19 NOTE — Discharge Instructions (Signed)
Your x-ray was normal with no evidence of fracture which is great news.  I believe that you have a muscle strain which is contributing to your symptoms.  Please take Naprosyn twice daily.  You should not take NSAIDs with this medication including aspirin, ibuprofen/Advil, naproxen/Aleve as it can cause stomach bleeding.  You can use Tylenol for breakthrough pain.  Use heat and gentle stretch for symptom relief.  You can also use Zanaflex which is a muscle relaxer up to twice a day.  This will make you sleepy so do not drive or drink alcohol while taking it.  If your symptoms or not improving quickly please follow-up with orthopedics.  If at any point anything worsens and you have severe pain, difficulty walking, numbness or tingling in the leg you should be seen immediately.

## 2022-02-16 ENCOUNTER — Other Ambulatory Visit: Payer: Self-pay

## 2022-02-16 ENCOUNTER — Encounter: Payer: Self-pay | Admitting: Physician Assistant

## 2022-02-16 ENCOUNTER — Ambulatory Visit (INDEPENDENT_AMBULATORY_CARE_PROVIDER_SITE_OTHER): Payer: 59 | Admitting: Physician Assistant

## 2022-02-16 VITALS — Ht 73.0 in | Wt 221.8 lb

## 2022-02-16 DIAGNOSIS — M25552 Pain in left hip: Secondary | ICD-10-CM | POA: Diagnosis not present

## 2022-02-16 MED ORDER — TIZANIDINE HCL 4 MG PO TABS: 4.0000 mg | ORAL_TABLET | Freq: Two times a day (BID) | ORAL | 1 refills | Status: DC | PRN

## 2022-02-16 MED ORDER — NAPROXEN 500 MG PO TABS: 500.0000 mg | ORAL_TABLET | Freq: Two times a day (BID) | ORAL | 0 refills | Status: DC

## 2022-02-16 NOTE — Progress Notes (Signed)
? ?Office Visit Note ?  ?Patient: Seth Kim           ?Date of Birth: 1981-11-13           ?MRN: Seth Kim ?Visit Date: 02/16/2022 ?             ?Requested by: No referring provider defined for this encounter. ?PCP: Patient, No Pcp Per (Inactive) ? ? ?Assessment & Plan: ?Visit Diagnoses:  ?1. Pain in left hip   ? ? ?Plan: ?Given the fact that pains been ongoing for over a month now and he is having mechanical symptoms of giving way recommend MRI.  This would be an MRI arthrogram of the left hip to rule out labral tear.  Also rule out any other internal derangement.  Having follow-up after the MRI to go over results discuss further treatment.  His naproxen and Zanaflex were refilled today.  Questions were encouraged and answered at length. ? ?Follow-Up Instructions: Return After MRI.  ? ?Orders:  ?No orders of the defined types were placed in this encounter. ? ?Meds ordered this encounter  ?Medications  ? naproxen (NAPROSYN) 500 MG tablet  ?  Sig: Take 1 tablet (500 mg total) by mouth 2 (two) times daily with a meal.  ?  Dispense:  60 tablet  ?  Refill:  0  ? tiZANidine (ZANAFLEX) 4 MG tablet  ?  Sig: Take 1 tablet (4 mg total) by mouth 2 (two) times daily as needed for muscle spasms.  ?  Dispense:  40 tablet  ?  Refill:  1  ? ? ? ? Procedures: ?No procedures performed ? ? ?Clinical Data: ?No additional findings. ? ? ?Subjective: ?Chief Complaint  ?Patient presents with  ? Left Hip - Pain  ? ? ?HPI ?Seth Kim is a  41 year old male comes in today with left hip pain has been ongoing since he jumped off of a forklift and landed on his leg hard on 01/17/2022.  He was seen elsewhere and radiographs were obtained of his left hip.  He was told he had no fracture most likely represented a muscle strain.  However he continues to have severe pain in the hip.  Describes pain as 8 out of 10 pain at worst.  Is worse with ambulating and whenever he first gets up in the morning.  Also with hip flexion.  He notes that hip feels like  it is going to give way on him.  No catching locking.  Pain is just in the groin and does not radiate down the leg.  He has tried naproxen and tenacity which helps some.  He is currently out of these medications.  He has had no prior hip pain. ?Radiographs radiographs of the left hip and pelvis are reviewed from urgent care they are dated 01/19/2022 and showed no acute fracture no bony abnormalities.  Both hips are well located.  No significant arthropathy. ? ?Review of Systems  ?Constitutional:  Negative for chills and fever.  ? ? ?Objective: ?Vital Signs: Ht 6\' 1"  (1.854 m)   Wt 221 lb 12.8 oz (100.6 kg)   BMI 29.26 kg/m?  ? ?Physical Exam ?Constitutional:   ?   Appearance: He is not ill-appearing or diaphoretic.  ?Pulmonary:  ?   Effort: Pulmonary effort is normal.  ?Neurological:  ?   Mental Status: He is alert and oriented to person, place, and time.  ? ? ?Ortho Exam ?Bilateral hips good range of motion with internal/external rotation.  Flexion of the left  hip causes pain both actively and passively.  There is an area of ecchymosis over the anterior aspect of the left hip.  No tenderness over the trochanteric region bilateral hips. ?Specialty Comments:  ?No specialty comments available. ? ?Imaging: ?No results found. ? ? ?PMFS History: ?Patient Active Problem List  ? Diagnosis Date Noted  ? Elevated transaminase level   ? AKI (acute kidney injury) (Seth Kim) 01/24/2015  ? Lactic acidosis 01/24/2015  ? Leukocytosis 01/24/2015  ? Abnormal transaminases 01/24/2015  ? Altered mental status 01/23/2015  ? Paranoid delusion (Seth Kim) 01/23/2015  ? ?Past Medical History:  ?Diagnosis Date  ? Asthma   ? Bipolar disorder, unspecified (Seth Kim) 2015  ? Schizophrenia (Seth Kim)   ?  ?Family History  ?Problem Relation Age of Onset  ? Asthma Father   ?  ?Past Surgical History:  ?Procedure Laterality Date  ? NO PAST SURGERIES    ? ?Social History  ? ?Occupational History  ? Not on file  ?Tobacco Use  ? Smoking status: Some Days  ? Smokeless  tobacco: Never  ?Vaping Use  ? Vaping Use: Never used  ?Substance and Sexual Activity  ? Alcohol use: Yes  ?  Comment: Drinks heavily per mother   ? Drug use: Yes  ?  Types: Cocaine, Marijuana  ?  Comment: Per labs   ? Sexual activity: Not on file  ? ? ? ? ? ? ?

## 2022-02-16 NOTE — Addendum Note (Signed)
Addended by: Barbette Or on: 02/16/2022 04:20 PM ? ? Modules accepted: Orders ? ?

## 2022-02-20 ENCOUNTER — Telehealth: Payer: Self-pay | Admitting: Physician Assistant

## 2022-02-20 NOTE — Telephone Encounter (Signed)
LMOM for pt to return call to sch MRI left hip review with Bronson Curb after 03/09/22 ?

## 2022-03-09 ENCOUNTER — Ambulatory Visit
Admission: RE | Admit: 2022-03-09 | Discharge: 2022-03-09 | Disposition: A | Discharge status: Home / Self Care | Payer: 59 | Source: Ambulatory Visit | Attending: Physician Assistant | Admitting: Physician Assistant

## 2022-03-09 DIAGNOSIS — M25552 Pain in left hip: Secondary | ICD-10-CM

## 2022-03-09 MED ORDER — GADOBENATE DIMEGLUMINE 529 MG/ML IV SOLN
17.0000 mL | Freq: Once | INTRAVENOUS | Status: AC | PRN
  Administered 2022-03-09: 17 mL via INTRAVENOUS

## 2022-03-09 MED ORDER — IOPAMIDOL (ISOVUE-M 200) INJECTION 41%
15.0000 mL | Freq: Once | INTRAMUSCULAR | Status: AC
  Administered 2022-03-09: 15 mL via INTRA_ARTICULAR

## 2022-03-15 ENCOUNTER — Ambulatory Visit (INDEPENDENT_AMBULATORY_CARE_PROVIDER_SITE_OTHER): Payer: 59 | Admitting: Orthopaedic Surgery

## 2022-03-15 ENCOUNTER — Encounter: Payer: Self-pay | Admitting: Orthopaedic Surgery

## 2022-03-15 DIAGNOSIS — M87052 Idiopathic aseptic necrosis of left femur: Secondary | ICD-10-CM | POA: Diagnosis not present

## 2022-03-15 DIAGNOSIS — M87051 Idiopathic aseptic necrosis of right femur: Secondary | ICD-10-CM | POA: Insufficient documentation

## 2022-03-15 MED ORDER — TIZANIDINE HCL 4 MG PO TABS: 4.0000 mg | ORAL_TABLET | Freq: Three times a day (TID) | ORAL | 1 refills | Status: DC | PRN

## 2022-03-15 MED ORDER — ACETAMINOPHEN-CODEINE #3 300-30 MG PO TABS: 1.0000 | ORAL_TABLET | Freq: Three times a day (TID) | ORAL | 0 refills | Status: DC | PRN

## 2022-03-15 NOTE — Progress Notes (Signed)
The patient is a very pleasant 41 year old gentleman who comes in today to go over MRI of his left hip.  It is been hurting significantly worse after jumping off and landing awkwardly off a forklift in February at work.  His pain is worse with weightbearing and it does wake him up at night.  He denies any right hip pain. ? ?He has full range of motion of both hips but on the left side there is pain in the groin with internal and external rotation. ? ?The MRIs reviewed with him and I showed him the images.  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  There is no femoral head collapse but the AVN is significant.  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  He is asymptomatic with that side. ? ?Given the significance of the avascular process of his left hip, we are recommending hip replacement.  This is unfortunate given his young age however his AVN is advanced and this is going to continue to get worse rapidly.  He can offload that hip with a cane for now.  We will have to keep him out of work until we get surgery scheduled and he will likely be out of work the next 3 months as he recovers from this type of surgery.  I went over hip replacement model with him.  I discussed the risks and benefits of this type of surgery.  We talked about what to expect from the intraoperative and postoperative course.  I showed him a hip replacement model and again gave him a handout about hip replacement surgery.  I talked him in length in detail about causes of a vast necrosis.  He used to drink alcohol more significantly and this is likely the source or etiology of his AVN.  Certainly landing off the forklift awkwardly is probably irritated the hip but is not because of his AVN.  We will be in touch about surgery scheduling for a left hip replacement.  All questions and concerns were answered and addressed. ?

## 2022-04-13 ENCOUNTER — Other Ambulatory Visit: Payer: Self-pay

## 2022-04-24 ENCOUNTER — Other Ambulatory Visit: Payer: Self-pay | Admitting: Physician Assistant

## 2022-05-09 ENCOUNTER — Other Ambulatory Visit (HOSPITAL_COMMUNITY): Payer: 59

## 2022-05-09 NOTE — Patient Instructions (Signed)
DUE TO COVID-19 ONLY TWO VISITORS  (aged 43 and older)  ARE ALLOWED TO COME WITH YOU AND STAY IN THE WAITING ROOM ONLY DURING PRE OP AND PROCEDURE.   **NO VISITORS ARE ALLOWED IN THE SHORT STAY AREA OR RECOVERY ROOM!!**  IF YOU WILL BE ADMITTED INTO THE HOSPITAL YOU ARE ALLOWED ONLY FOUR SUPPORT PEOPLE DURING VISITATION HOURS ONLY (7 AM -8PM)   The support person(s) must pass our screening, gel in and out, and wear a mask at all times, including in the patient's room. Patients must also wear a mask when staff or their support person are in the room. Visitors GUEST BADGE MUST BE WORN VISIBLY  One adult visitor may remain with you overnight and MUST be in the room by 8 P.M.     Your procedure is scheduled on: 05/19/22   Report to [facility] Main Entrance    Report to admitting at 8:50 AM   Call this number if you have problems the morning of surgery [phone]   Do not eat food :After Midnight.   After Midnight you may have the following liquids until 8:35 AM DAY OF SURGERY  Water Black Coffee (sugar ok, NO MILK/CREAM OR CREAMERS)  Tea (sugar ok, NO MILK/CREAM OR CREAMERS) regular and decaf                             Plain Jell-O (NO RED)                                           Fruit ices (not with fruit pulp, NO RED)                                     Popsicles (NO RED)                                                                  Juice: apple, WHITE grape, WHITE cranberry Sports drinks like Gatorade (NO RED) Clear broth(vegetable,chicken,beef)  FOLLOW BOWEL PREP AND ANY ADDITIONAL PRE OP INSTRUCTIONS YOU RECEIVED FROM YOUR SURGEON'S OFFICE!!!     Oral Hygiene is also important to reduce your risk of infection.                                    Remember - BRUSH YOUR TEETH THE MORNING OF SURGERY WITH YOUR REGULAR TOOTHPASTE   Do NOT smoke after Midnight   Take these medicines the morning of surgery with A SIP OF WATER: None                              You  may not have any metal on your body including jewelry, and body piercing             Do not wear lotions, powders, cologne, or deodorant              Men may  shave face and neck.   Do not bring valuables to the hospital. [facility] IS NOT             RESPONSIBLE   FOR VALUABLES.   Bring small overnight bag day of surgery.   DO NOT BRING YOUR HOME MEDICATIONS TO THE HOSPITAL. PHARMACY WILL DISPENSE MEDICATIONS LISTED ON YOUR MEDICATION LIST TO YOU DURING YOUR ADMISSION IN THE HOSPITAL!               Please read over the following fact sheets you were given: IF YOU HAVE QUESTIONS ABOUT YOUR PRE-OP INSTRUCTIONS PLEASE CALL [phone]- [facility] - Preparing for Surgery Before surgery, you can play an important role.  Because skin is not sterile, your skin needs to be as free of germs as possible.  You can reduce the number of germs on your skin by washing with CHG (chlorahexidine gluconate) soap before surgery.  CHG is an antiseptic cleaner which kills germs and bonds with the skin to continue killing germs even after washing. Please DO NOT use if you have an allergy to CHG or antibacterial soaps.  If your skin becomes reddened/irritated stop using the CHG and inform your nurse when you arrive at Short Stay. Do not shave (including legs and underarms) for at least 48 hours prior to the first CHG shower.  You may shave your face/neck.  Please follow these instructions carefully:  1.  Shower with CHG Soap the night before surgery and the  morning of surgery.  2.  If you choose to wash your hair, wash your hair first as usual with your normal  shampoo.  3.  After you shampoo, rinse your hair and body thoroughly to remove the shampoo.                             4.  Use CHG as you would any other liquid soap.  You can apply chg directly to the skin and wash.  Gently with a scrungie or clean washcloth.  5.  Apply the CHG Soap to your body ONLY FROM THE NECK DOWN.   Do   not use on  face/ open                           Wound or open sores. Avoid contact with eyes, ears mouth and   genitals (private parts).                       Wash face,  Genitals (private parts) with your normal soap.             6.  Wash thoroughly, paying special attention to the area where your    surgery  will be performed.  7.  Thoroughly rinse your body with warm water from the neck down.  8.  DO NOT shower/wash with your normal soap after using and rinsing off the CHG Soap.                9.  Pat yourself dry with a clean towel.            10.  Wear clean pajamas.            11.  Place clean sheets on your bed the night of your first shower and do not  sleep with pets. Day of Surgery : Do  not apply any lotions/deodorants the morning of surgery.  Please wear clean clothes to the hospital/surgery center.  FAILURE TO FOLLOW THESE INSTRUCTIONS MAY RESULT IN THE CANCELLATION OF YOUR SURGERY  PATIENT SIGNATURE_________________________________  NURSE SIGNATURE__________________________________  ________________________________________________________________________  WHAT IS A BLOOD TRANSFUSION? Blood Transfusion Information  A transfusion is the replacement of blood or some of its parts. Blood is made up of multiple cells which provide different functions. Red blood cells carry oxygen and are used for blood loss replacement. White blood cells fight against infection. Platelets control bleeding. Plasma helps clot blood. Other blood products are available for specialized needs, such as hemophilia or other clotting disorders. BEFORE THE TRANSFUSION  Who gives blood for transfusions?  Healthy volunteers who are fully evaluated to make sure their blood is safe. This is blood bank blood. Transfusion therapy is the safest it has ever been in the practice of medicine. Before blood is taken from a donor, a complete history is taken to make sure that person has no history of diseases nor engages in risky  social behavior (examples are intravenous drug use or sexual activity with multiple partners). The donor's travel history is screened to minimize risk of transmitting infections, such as malaria. The donated blood is tested for signs of infectious diseases, such as HIV and hepatitis. The blood is then tested to be sure it is compatible with you in order to minimize the chance of a transfusion reaction. If you or a relative donates blood, this is often done in anticipation of surgery and is not appropriate for emergency situations. It takes many days to process the donated blood. RISKS AND COMPLICATIONS Although transfusion therapy is very safe and saves many lives, the main dangers of transfusion include:  Getting an infectious disease. Developing a transfusion reaction. This is an allergic reaction to something in the blood you were given. Every precaution is taken to prevent this. The decision to have a blood transfusion has been considered carefully by your caregiver before blood is given. Blood is not given unless the benefits outweigh the risks. AFTER THE TRANSFUSION Right after receiving a blood transfusion, you will usually feel much better and more energetic. This is especially true if your red blood cells have gotten low (anemic). The transfusion raises the level of the red blood cells which carry oxygen, and this usually causes an energy increase. The nurse administering the transfusion will monitor you carefully for complications. HOME CARE INSTRUCTIONS  No special instructions are needed after a transfusion. You may find your energy is better. Speak with your caregiver about any limitations on activity for underlying diseases you may have. SEEK MEDICAL CARE IF:  Your condition is not improving after your transfusion. You develop redness or irritation at the intravenous (IV) site. SEEK IMMEDIATE MEDICAL CARE IF:  Any of the following symptoms occur over the next 12 hours: Shaking  chills. You have a temperature by mouth above 102 F (38.9 C), not controlled by medicine. Chest, back, or muscle pain. People around you feel you are not acting correctly or are confused. Shortness of breath or difficulty breathing. Dizziness and fainting. You get a rash or develop hives. You have a decrease in urine output. Your urine turns a dark color or changes to pink, red, or brown. Any of the following symptoms occur over the next 10 days: You have a temperature by mouth above 102 F (38.9 C), not controlled by medicine. Shortness of breath. Weakness after normal activity. The white part of the eye  turns yellow (jaundice). You have a decrease in the amount of urine or are urinating less often. Your urine turns a dark color or changes to pink, red, or brown. Document Released: 11/17/2000 Document Revised: 02/12/2012 Document Reviewed: 07/06/2008 [facility] Patient Information 2014 [employer] [place].  _______________________________________________________________________

## 2022-05-09 NOTE — Progress Notes (Signed)
Please place orders for PAT appointment scheduled 05/10/22 

## 2022-05-10 ENCOUNTER — Other Ambulatory Visit: Payer: Self-pay | Admitting: Physician Assistant

## 2022-05-10 ENCOUNTER — Encounter (HOSPITAL_COMMUNITY): Payer: Self-pay

## 2022-05-10 ENCOUNTER — Encounter (HOSPITAL_COMMUNITY)
Admission: RE | Admit: 2022-05-10 | Discharge: 2022-05-10 | Disposition: A | Discharge status: Home / Self Care | Payer: 59 | Source: Ambulatory Visit | Attending: Orthopaedic Surgery | Admitting: Orthopaedic Surgery

## 2022-05-10 VITALS — BP 134/92 | HR 80 | Temp 97.8°F | Resp 12 | Ht 73.0 in | Wt 223.4 lb

## 2022-05-10 DIAGNOSIS — Z01818 Encounter for other preprocedural examination: Secondary | ICD-10-CM

## 2022-05-10 DIAGNOSIS — Z01812 Encounter for preprocedural laboratory examination: Secondary | ICD-10-CM | POA: Diagnosis not present

## 2022-05-10 DIAGNOSIS — M87052 Idiopathic aseptic necrosis of left femur: Secondary | ICD-10-CM

## 2022-05-10 LAB — CBC
HCT: 49.3 % (ref 39.0–52.0)
Hemoglobin: 16.1 g/dL (ref 13.0–17.0)
MCH: 31.5 pg (ref 26.0–34.0)
MCHC: 32.7 g/dL (ref 30.0–36.0)
MCV: 96.5 fL (ref 80.0–100.0)
Platelets: 293 10*3/uL (ref 150–400)
RBC: 5.11 MIL/uL (ref 4.22–5.81)
RDW: 12.3 % (ref 11.5–15.5)
WBC: 7.8 10*3/uL (ref 4.0–10.5)
nRBC: 0 % (ref 0.0–0.2)

## 2022-05-10 LAB — SURGICAL PCR SCREEN
MRSA, PCR: NEGATIVE
Staphylococcus aureus: NEGATIVE

## 2022-05-10 NOTE — Progress Notes (Signed)
COVID Vaccine Completed: yes x2  Date of COVID positive in last 90 days: no  PCP - n/a Cardiologist - n/a  Chest x-ray - n/a EKG - n/a Stress Test - n/a ECHO - n/a Cardiac Cath - n/a Pacemaker/ICD device last checked: n/a Spinal Cord Stimulator: n/a  Bowel Prep - no  Sleep Study - n/a CPAP -   Fasting Blood Sugar - n/a Checks Blood Sugar _____ times a day  Blood Thinner Instructions: n/a Aspirin Instructions: Last Dose:  Activity level: Can go up a flight of stairs and perform activities of daily living without stopping and without symptoms of chest pain or shortness of breath.   Anesthesia review:   Patient denies shortness of breath, fever, cough and chest pain at PAT appointment  Patient verbalized understanding of instructions that were given to them at the PAT appointment. Patient was also instructed that they will need to review over the PAT instructions again at home before surgery.

## 2022-05-18 ENCOUNTER — Encounter (HOSPITAL_COMMUNITY): Payer: Self-pay | Admitting: Orthopaedic Surgery

## 2022-05-18 NOTE — H&P (Signed)
TOTAL HIP ADMISSION H&P  Patient is admitted for left total hip arthroplasty.  Subjective:  Chief Complaint: left hip pain  HPI: Seth Kim, 41 y.o. male, has a history of pain and functional disability in the left hip(s) due to  avascular necrosis  and patient has failed non-surgical conservative treatments for greater than 12 weeks to include NSAID's and/or analgesics, corticosteriod injections, and activity modification.  Onset of symptoms was abrupt starting  5 months  ago with rapidlly worsening course since that time.The patient noted no past surgery on the left hip(s).  Patient currently rates pain in the left hip at 10 out of 10 with activity. Patient has night pain, worsening of pain with activity and weight bearing, pain that interfers with activities of daily living, and pain with passive range of motion. Patient has evidence of subchondral cysts and AVN  by imaging studies. This condition presents safety issues increasing the risk of falls.  There is no current active infection.  Patient Active Problem List   Diagnosis Date Noted   Avascular necrosis of bone of left hip (HCC) 03/15/2022   Avascular necrosis of hip, right (HCC) 03/15/2022   Elevated transaminase level    AKI (acute kidney injury) (HCC) 01/24/2015   Lactic acidosis 01/24/2015   Leukocytosis 01/24/2015   Abnormal transaminases 01/24/2015   Altered mental status 01/23/2015   Paranoid delusion (HCC) 01/23/2015   Past Medical History:  Diagnosis Date   Asthma    Bipolar disorder, unspecified (HCC) 2015   Schizophrenia (HCC)     Past Surgical History:  Procedure Laterality Date   NO PAST SURGERIES      No current facility-administered medications for this encounter.   Current Outpatient Medications  Medication Sig Dispense Refill Last Dose   ibuprofen (ADVIL) 200 MG tablet Take 600 mg by mouth every 8 (eight) hours as needed (tooth pain.).      naproxen (NAPROSYN) 500 MG tablet TAKE 1 TABLET(500 MG) BY  MOUTH TWICE DAILY WITH A MEAL (Patient taking differently: Take 500 mg by mouth 2 (two) times daily as needed (pain.).) 60 tablet 0    tiZANidine (ZANAFLEX) 4 MG tablet Take 1 tablet (4 mg total) by mouth every 8 (eight) hours as needed for muscle spasms. 40 tablet 1    acetaminophen-codeine (TYLENOL #3) 300-30 MG tablet Take 1-2 tablets by mouth every 8 (eight) hours as needed. (Patient not taking: Reported on 05/03/2022) 30 tablet 0 Not Taking   Allergies  Allergen Reactions   Penicillins Other (See Comments)    unknown    Social History   Tobacco Use   Smoking status: Some Days   Smokeless tobacco: Never  Substance Use Topics   Alcohol use: Yes    Comment: not past drink, usually few beers a day    Family History  Problem Relation Age of Onset   Asthma Father      Review of Systems  Musculoskeletal:  Positive for gait problem.  All other systems reviewed and are negative.   Objective:  Physical Exam Vitals reviewed.  Constitutional:      Appearance: Normal appearance.  HENT:     Head: Normocephalic and atraumatic.  Eyes:     Extraocular Movements: Extraocular movements intact.     Pupils: Pupils are equal, round, and reactive to light.  Cardiovascular:     Rate and Rhythm: Normal rate and regular rhythm.     Pulses: Normal pulses.  Pulmonary:     Effort: Pulmonary effort is normal.  Breath sounds: Normal breath sounds.  Abdominal:     Palpations: Abdomen is soft.  Musculoskeletal:     Cervical back: Normal range of motion and neck supple.     Left hip: Tenderness and bony tenderness present. Decreased strength.  Neurological:     Mental Status: He is alert and oriented to person, place, and time.  Psychiatric:        Behavior: Behavior normal.     Vital signs in last 24 hours:    Labs:   Estimated body mass index is 29.47 kg/m as calculated from the following:   Height as of 05/10/22: 6\' 1"  (1.854 m).   Weight as of 05/10/22: 101.3 kg.   Imaging  Review Plain radiographs demonstrate severe AVN of the left hip(s). The bone quality appears to be good for age and reported activity level.      Assessment/Plan:  Avascular necrosis, left hip(s)  The patient history, physical examination, clinical judgement of the provider and imaging studies are consistent with AVN of the left hip(s) and total hip arthroplasty is deemed medically necessary. The treatment options including medical management, injection therapy, arthroscopy and arthroplasty were discussed at length. The risks and benefits of total hip arthroplasty were presented and reviewed. The risks due to aseptic loosening, infection, stiffness, dislocation/subluxation,  thromboembolic complications and other imponderables were discussed.  The patient acknowledged the explanation, agreed to proceed with the plan and consent was signed. Patient is being admitted for inpatient treatment for surgery, pain control, PT, OT, prophylactic antibiotics, VTE prophylaxis, progressive ambulation and ADL's and discharge planning.The patient is planning to be discharged home with home health services

## 2022-05-19 ENCOUNTER — Observation Stay (HOSPITAL_COMMUNITY): Payer: 59

## 2022-05-19 ENCOUNTER — Ambulatory Visit (HOSPITAL_COMMUNITY): Payer: 59 | Admitting: Anesthesiology

## 2022-05-19 ENCOUNTER — Observation Stay (HOSPITAL_COMMUNITY)
Admission: RE | Admit: 2022-05-19 | Discharge: 2022-05-20 | Disposition: A | Discharge status: Home Health | Payer: 59 | Attending: Orthopaedic Surgery | Admitting: Orthopaedic Surgery

## 2022-05-19 ENCOUNTER — Ambulatory Visit (HOSPITAL_BASED_OUTPATIENT_CLINIC_OR_DEPARTMENT_OTHER): Payer: 59 | Admitting: Anesthesiology

## 2022-05-19 ENCOUNTER — Ambulatory Visit (HOSPITAL_COMMUNITY): Payer: 59

## 2022-05-19 ENCOUNTER — Other Ambulatory Visit: Payer: Self-pay

## 2022-05-19 ENCOUNTER — Encounter (HOSPITAL_COMMUNITY)
Admission: RE | Disposition: A | Discharge status: Home Health | Payer: Self-pay | Source: Home / Self Care | Attending: Orthopaedic Surgery

## 2022-05-19 ENCOUNTER — Encounter (HOSPITAL_COMMUNITY): Payer: Self-pay | Admitting: Orthopaedic Surgery

## 2022-05-19 DIAGNOSIS — Z96642 Presence of left artificial hip joint: Secondary | ICD-10-CM

## 2022-05-19 DIAGNOSIS — M1612 Unilateral primary osteoarthritis, left hip: Principal | ICD-10-CM | POA: Insufficient documentation

## 2022-05-19 DIAGNOSIS — F319 Bipolar disorder, unspecified: Secondary | ICD-10-CM

## 2022-05-19 DIAGNOSIS — J45909 Unspecified asthma, uncomplicated: Secondary | ICD-10-CM | POA: Diagnosis not present

## 2022-05-19 DIAGNOSIS — Z01818 Encounter for other preprocedural examination: Secondary | ICD-10-CM

## 2022-05-19 DIAGNOSIS — M879 Osteonecrosis, unspecified: Secondary | ICD-10-CM | POA: Diagnosis not present

## 2022-05-19 DIAGNOSIS — F209 Schizophrenia, unspecified: Secondary | ICD-10-CM

## 2022-05-19 DIAGNOSIS — M87052 Idiopathic aseptic necrosis of left femur: Secondary | ICD-10-CM | POA: Diagnosis not present

## 2022-05-19 HISTORY — PX: TOTAL HIP ARTHROPLASTY: SHX124

## 2022-05-19 LAB — TYPE AND SCREEN
ABO/RH(D): B POS
Antibody Screen: NEGATIVE

## 2022-05-19 LAB — ABO/RH: ABO/RH(D): B POS

## 2022-05-19 SURGERY — ARTHROPLASTY, HIP, TOTAL, ANTERIOR APPROACH
Anesthesia: Spinal | Site: Hip | Laterality: Left

## 2022-05-19 MED ORDER — TRANEXAMIC ACID-NACL 1000-0.7 MG/100ML-% IV SOLN
1000.0000 mg | INTRAVENOUS | Status: AC
  Administered 2022-05-19: 1000 mg via INTRAVENOUS
  Filled 2022-05-19: qty 100

## 2022-05-19 MED ORDER — CEFAZOLIN SODIUM-DEXTROSE 1-4 GM/50ML-% IV SOLN
1.0000 g | Freq: Four times a day (QID) | INTRAVENOUS | Status: AC
  Administered 2022-05-19 (×2): 1 g via INTRAVENOUS
  Filled 2022-05-19 (×2): qty 50

## 2022-05-19 MED ORDER — STERILE WATER FOR IRRIGATION IR SOLN
Status: DC | PRN
  Administered 2022-05-19: 2000 mL

## 2022-05-19 MED ORDER — CELECOXIB 200 MG PO CAPS
ORAL_CAPSULE | ORAL | Status: AC
  Filled 2022-05-19: qty 1

## 2022-05-19 MED ORDER — ASPIRIN 81 MG PO CHEW
81.0000 mg | CHEWABLE_TABLET | Freq: Two times a day (BID) | ORAL | Status: DC
  Administered 2022-05-19 – 2022-05-20 (×2): 81 mg via ORAL
  Filled 2022-05-19 (×2): qty 1

## 2022-05-19 MED ORDER — DOCUSATE SODIUM 100 MG PO CAPS
100.0000 mg | ORAL_CAPSULE | Freq: Two times a day (BID) | ORAL | Status: DC
  Administered 2022-05-19 – 2022-05-20 (×2): 100 mg via ORAL
  Filled 2022-05-19 (×2): qty 1

## 2022-05-19 MED ORDER — BUPIVACAINE IN DEXTROSE 0.75-8.25 % IT SOLN
INTRATHECAL | Status: DC | PRN
  Administered 2022-05-19: 1.8 mL via INTRATHECAL

## 2022-05-19 MED ORDER — HYDROMORPHONE HCL 1 MG/ML IJ SOLN
0.5000 mg | INTRAMUSCULAR | Status: DC | PRN
  Administered 2022-05-19: 1 mg via INTRAVENOUS
  Filled 2022-05-19: qty 1

## 2022-05-19 MED ORDER — METHOCARBAMOL 500 MG IVPB - SIMPLE MED: 500.0000 mg | Freq: Four times a day (QID) | INTRAVENOUS | Status: DC | PRN

## 2022-05-19 MED ORDER — OXYCODONE HCL 5 MG PO TABS
5.0000 mg | ORAL_TABLET | ORAL | Status: DC | PRN
  Administered 2022-05-19: 10 mg via ORAL
  Filled 2022-05-19: qty 2

## 2022-05-19 MED ORDER — LIDOCAINE 2% (20 MG/ML) 5 ML SYRINGE
INTRAMUSCULAR | Status: DC | PRN
  Administered 2022-05-19: 80 mg via INTRAVENOUS

## 2022-05-19 MED ORDER — VANCOMYCIN HCL IN DEXTROSE 1-5 GM/200ML-% IV SOLN
INTRAVENOUS | Status: AC
  Filled 2022-05-19: qty 200

## 2022-05-19 MED ORDER — ONDANSETRON HCL 4 MG/2ML IJ SOLN: 4.0000 mg | Freq: Four times a day (QID) | INTRAMUSCULAR | Status: DC | PRN

## 2022-05-19 MED ORDER — CEFAZOLIN SODIUM-DEXTROSE 2-4 GM/100ML-% IV SOLN
INTRAVENOUS | Status: AC
  Filled 2022-05-19: qty 100

## 2022-05-19 MED ORDER — METOCLOPRAMIDE HCL 5 MG PO TABS: 5.0000 mg | ORAL_TABLET | Freq: Three times a day (TID) | ORAL | Status: DC | PRN

## 2022-05-19 MED ORDER — DIPHENHYDRAMINE HCL 12.5 MG/5ML PO ELIX: 12.5000 mg | ORAL_SOLUTION | ORAL | Status: DC | PRN

## 2022-05-19 MED ORDER — SODIUM CHLORIDE 0.9 % IR SOLN
Status: DC | PRN
  Administered 2022-05-19: 1000 mL

## 2022-05-19 MED ORDER — METHOCARBAMOL 500 MG IVPB - SIMPLE MED
INTRAVENOUS | Status: AC
  Filled 2022-05-19: qty 50

## 2022-05-19 MED ORDER — DEXMEDETOMIDINE (PRECEDEX) IN NS 20 MCG/5ML (4 MCG/ML) IV SYRINGE
PREFILLED_SYRINGE | INTRAVENOUS | Status: DC | PRN
  Administered 2022-05-19 (×5): 8 ug via INTRAVENOUS

## 2022-05-19 MED ORDER — SODIUM CHLORIDE 0.9 % IV SOLN: INTRAVENOUS | Status: DC

## 2022-05-19 MED ORDER — ONDANSETRON HCL 4 MG/2ML IJ SOLN
INTRAMUSCULAR | Status: DC | PRN
  Administered 2022-05-19: 4 mg via INTRAVENOUS

## 2022-05-19 MED ORDER — ACETAMINOPHEN 500 MG PO TABS
1000.0000 mg | ORAL_TABLET | Freq: Once | ORAL | Status: AC
  Administered 2022-05-19: 1000 mg via ORAL

## 2022-05-19 MED ORDER — PROPOFOL 500 MG/50ML IV EMUL
INTRAVENOUS | Status: DC | PRN
  Administered 2022-05-19: 85 ug/kg/min via INTRAVENOUS

## 2022-05-19 MED ORDER — CHLORHEXIDINE GLUCONATE 0.12 % MT SOLN
15.0000 mL | Freq: Once | OROMUCOSAL | Status: AC
  Administered 2022-05-19: 15 mL via OROMUCOSAL

## 2022-05-19 MED ORDER — METHOCARBAMOL 500 MG PO TABS
500.0000 mg | ORAL_TABLET | Freq: Four times a day (QID) | ORAL | Status: DC | PRN
  Administered 2022-05-19: 500 mg via ORAL
  Filled 2022-05-19: qty 1

## 2022-05-19 MED ORDER — PROMETHAZINE HCL 25 MG/ML IJ SOLN: 6.2500 mg | INTRAMUSCULAR | Status: DC | PRN

## 2022-05-19 MED ORDER — PHENOL 1.4 % MT LIQD: 1.0000 | OROMUCOSAL | Status: DC | PRN

## 2022-05-19 MED ORDER — PROPOFOL 1000 MG/100ML IV EMUL
INTRAVENOUS | Status: AC
  Filled 2022-05-19: qty 100

## 2022-05-19 MED ORDER — LACTATED RINGERS IV SOLN: INTRAVENOUS | Status: DC

## 2022-05-19 MED ORDER — MENTHOL 3 MG MT LOZG: 1.0000 | LOZENGE | OROMUCOSAL | Status: DC | PRN

## 2022-05-19 MED ORDER — DEXAMETHASONE SODIUM PHOSPHATE 10 MG/ML IJ SOLN
INTRAMUSCULAR | Status: DC | PRN
  Administered 2022-05-19: 4 mg via INTRAVENOUS

## 2022-05-19 MED ORDER — FENTANYL CITRATE PF 50 MCG/ML IJ SOSY
PREFILLED_SYRINGE | INTRAMUSCULAR | Status: AC
  Filled 2022-05-19: qty 2

## 2022-05-19 MED ORDER — FENTANYL CITRATE (PF) 100 MCG/2ML IJ SOLN
INTRAMUSCULAR | Status: AC
  Filled 2022-05-19: qty 2

## 2022-05-19 MED ORDER — ACETAMINOPHEN 325 MG PO TABS: 325.0000 mg | ORAL_TABLET | Freq: Four times a day (QID) | ORAL | Status: DC | PRN

## 2022-05-19 MED ORDER — FENTANYL CITRATE (PF) 100 MCG/2ML IJ SOLN
INTRAMUSCULAR | Status: DC | PRN
  Administered 2022-05-19: 50 ug via INTRAVENOUS

## 2022-05-19 MED ORDER — ACETAMINOPHEN 500 MG PO TABS
ORAL_TABLET | ORAL | Status: AC
  Filled 2022-05-19: qty 2

## 2022-05-19 MED ORDER — PANTOPRAZOLE SODIUM 40 MG PO TBEC
40.0000 mg | DELAYED_RELEASE_TABLET | Freq: Every day | ORAL | Status: DC
  Administered 2022-05-19 – 2022-05-20 (×2): 40 mg via ORAL
  Filled 2022-05-19 (×2): qty 1

## 2022-05-19 MED ORDER — AMISULPRIDE (ANTIEMETIC) 5 MG/2ML IV SOLN: 10.0000 mg | Freq: Once | INTRAVENOUS | Status: DC | PRN

## 2022-05-19 MED ORDER — ALUM & MAG HYDROXIDE-SIMETH 200-200-20 MG/5ML PO SUSP: 30.0000 mL | ORAL | Status: DC | PRN

## 2022-05-19 MED ORDER — DEXMEDETOMIDINE (PRECEDEX) IN NS 20 MCG/5ML (4 MCG/ML) IV SYRINGE
PREFILLED_SYRINGE | INTRAVENOUS | Status: AC
  Filled 2022-05-19: qty 5

## 2022-05-19 MED ORDER — POVIDONE-IODINE 10 % EX SWAB
2.0000 | Freq: Once | CUTANEOUS | Status: AC
  Administered 2022-05-19: 2 via TOPICAL

## 2022-05-19 MED ORDER — ORAL CARE MOUTH RINSE: 15.0000 mL | Freq: Once | OROMUCOSAL | Status: AC

## 2022-05-19 MED ORDER — MIDAZOLAM HCL 2 MG/2ML IJ SOLN
INTRAMUSCULAR | Status: AC
  Filled 2022-05-19: qty 2

## 2022-05-19 MED ORDER — ONDANSETRON HCL 4 MG PO TABS: 4.0000 mg | ORAL_TABLET | Freq: Four times a day (QID) | ORAL | Status: DC | PRN

## 2022-05-19 MED ORDER — CELECOXIB 200 MG PO CAPS
200.0000 mg | ORAL_CAPSULE | Freq: Once | ORAL | Status: AC
  Administered 2022-05-19: 200 mg via ORAL

## 2022-05-19 MED ORDER — VANCOMYCIN HCL IN DEXTROSE 1-5 GM/200ML-% IV SOLN: 1000.0000 mg | INTRAVENOUS | Status: DC

## 2022-05-19 MED ORDER — VANCOMYCIN HCL 1500 MG/300ML IV SOLN
1500.0000 mg | INTRAVENOUS | Status: DC
  Administered 2022-05-19: 1000 mg via INTRAVENOUS
  Filled 2022-05-19: qty 300

## 2022-05-19 MED ORDER — DOXYCYCLINE HYCLATE 100 MG PO TABS
100.0000 mg | ORAL_TABLET | Freq: Two times a day (BID) | ORAL | Status: DC
  Administered 2022-05-19 – 2022-05-20 (×2): 100 mg via ORAL
  Filled 2022-05-19 (×2): qty 1

## 2022-05-19 MED ORDER — FENTANYL CITRATE PF 50 MCG/ML IJ SOSY: 25.0000 ug | PREFILLED_SYRINGE | INTRAMUSCULAR | Status: DC | PRN

## 2022-05-19 MED ORDER — METOCLOPRAMIDE HCL 5 MG/ML IJ SOLN: 5.0000 mg | Freq: Three times a day (TID) | INTRAMUSCULAR | Status: DC | PRN

## 2022-05-19 MED ORDER — OXYCODONE HCL 5 MG PO TABS
10.0000 mg | ORAL_TABLET | ORAL | Status: DC | PRN
  Administered 2022-05-19 – 2022-05-20 (×3): 15 mg via ORAL
  Filled 2022-05-19 (×3): qty 3

## 2022-05-19 MED ORDER — MIDAZOLAM HCL 5 MG/5ML IJ SOLN
INTRAMUSCULAR | Status: DC | PRN
  Administered 2022-05-19: 2 mg via INTRAVENOUS

## 2022-05-19 MED ORDER — 0.9 % SODIUM CHLORIDE (POUR BTL) OPTIME
TOPICAL | Status: DC | PRN
  Administered 2022-05-19: 1000 mL

## 2022-05-19 MED ORDER — PROPOFOL 10 MG/ML IV BOLUS
INTRAVENOUS | Status: DC | PRN
  Administered 2022-05-19: 20 mg via INTRAVENOUS

## 2022-05-19 MED ORDER — CEFAZOLIN SODIUM-DEXTROSE 2-3 GM-%(50ML) IV SOLR
INTRAVENOUS | Status: DC | PRN
  Administered 2022-05-19: 2 g via INTRAVENOUS

## 2022-05-19 SURGICAL SUPPLY — 40 items
ACETAB CUP W/GRIPTION 54 (Plate) ×2 IMPLANT
BAG COUNTER SPONGE SURGICOUNT (BAG) ×2 IMPLANT
BLADE SAW SGTL 18X1.27X75 (BLADE) ×2 IMPLANT
COVER PERINEAL POST (MISCELLANEOUS) ×2 IMPLANT
COVER SURGICAL LIGHT HANDLE (MISCELLANEOUS) ×2 IMPLANT
CUP ACETAB W/GRIPTION 54 (Plate) IMPLANT
DRAPE FOOT SWITCH (DRAPES) ×2 IMPLANT
DRAPE STERI IOBAN 125X83 (DRAPES) ×2 IMPLANT
DRAPE U-SHAPE 47X51 STRL (DRAPES) ×4 IMPLANT
DRESSING MEPILEX FLEX 4X4 (GAUZE/BANDAGES/DRESSINGS) IMPLANT
DRSG AQUACEL AG ADV 3.5X10 (GAUZE/BANDAGES/DRESSINGS) ×2 IMPLANT
DRSG MEPILEX FLEX 4X4 (GAUZE/BANDAGES/DRESSINGS) ×2
DURAPREP 26ML APPLICATOR (WOUND CARE) ×2 IMPLANT
ELECT REM PT RETURN 15FT ADLT (MISCELLANEOUS) ×2 IMPLANT
GAUZE SPONGE 4X4 12PLY STRL (GAUZE/BANDAGES/DRESSINGS) ×1 IMPLANT
GAUZE XEROFORM 1X8 LF (GAUZE/BANDAGES/DRESSINGS) ×2 IMPLANT
GLOVE BIO SURGEON STRL SZ7.5 (GLOVE) ×2 IMPLANT
GLOVE BIOGEL PI IND STRL 7.5 (GLOVE) IMPLANT
GLOVE BIOGEL PI IND STRL 8 (GLOVE) ×2 IMPLANT
GLOVE BIOGEL PI INDICATOR 7.5 (GLOVE) ×4
GLOVE BIOGEL PI INDICATOR 8 (GLOVE) ×3
GLOVE ECLIPSE 8.0 STRL XLNG CF (GLOVE) ×2 IMPLANT
GOWN STRL REUS W/ TWL XL LVL3 (GOWN DISPOSABLE) ×2 IMPLANT
GOWN STRL REUS W/TWL XL LVL3 (GOWN DISPOSABLE) ×2
HANDPIECE INTERPULSE COAX TIP (DISPOSABLE) ×2
HEAD CERAMIC 36 PLUS 8.5 12 14 (Hips) ×1 IMPLANT
HOLDER FOLEY CATH W/STRAP (MISCELLANEOUS) ×2 IMPLANT
KIT TURNOVER KIT A (KITS) ×1 IMPLANT
LINER NEUTRAL 36ID 54OD (Liner) ×1 IMPLANT
PACK ANTERIOR HIP CUSTOM (KITS) ×2 IMPLANT
SET HNDPC FAN SPRY TIP SCT (DISPOSABLE) ×1 IMPLANT
STAPLER VISISTAT 35W (STAPLE) ×1 IMPLANT
STEM FEMORAL SZ5 HIGH ACTIS (Stem) ×1 IMPLANT
SUT ETHIBOND NAB CT1 #1 30IN (SUTURE) ×2 IMPLANT
SUT VIC AB 0 CT1 36 (SUTURE) ×2 IMPLANT
SUT VIC AB 1 CT1 36 (SUTURE) ×2 IMPLANT
SUT VIC AB 2-0 CT1 27 (SUTURE) ×2
SUT VIC AB 2-0 CT1 TAPERPNT 27 (SUTURE) ×2 IMPLANT
TRAY FOLEY MTR SLVR 16FR STAT (SET/KITS/TRAYS/PACK) ×1 IMPLANT
YANKAUER SUCT BULB TIP NO VENT (SUCTIONS) ×2 IMPLANT

## 2022-05-19 NOTE — Interval H&P Note (Signed)
History and Physical Interval Note: The patient understands that he is here today for a left total hip replacement to treat his severe left hip avascular necrosis.  There has been no acute or interval change in his medical status.  Please see H&P.  The risks and benefits of surgery been explained in detail and informed consent is obtained.  The left operative hip has been marked.  05/19/2022 9:50 AM  Kumar D Voorhees  has presented today for surgery, with the diagnosis of LEFT HIP AVASCULAR NECROSIS.  The various methods of treatment have been discussed with the patient and family. After consideration of risks, benefits and other options for treatment, the patient has consented to  Procedure(s): LEFT TOTAL HIP ARTHROPLASTY ANTERIOR APPROACH (Left) as a surgical intervention.  The patient's history has been reviewed, patient examined, no change in status, stable for surgery.  I have reviewed the patient's chart and labs.  Questions were answered to the patient's satisfaction.     Kathryne Hitch

## 2022-05-19 NOTE — Evaluation (Signed)
Physical Therapy Evaluation Patient Details Name: Seth Kim MRN: [id number] DOB: 08/24/1981 Today's Date: 05/19/2022  History of Present Illness  Pt s/p L THR and with hx of bipolar and schizoprenia  Clinical Impression  Pt s/p L THR and presents with decreased L LE strength/ROM and post op pain limiting functional mobility.  Pt should progress to dc home with family assist.     Recommendations for follow up therapy are one component of a multi-disciplinary discharge planning process, led by the attending physician.  Recommendations may be updated based on patient status, additional functional criteria and insurance authorization.  Follow Up Recommendations Follow physician's recommendations for discharge plan and follow up therapies    Assistance Recommended at Discharge Intermittent Supervision/Assistance  Patient can return home with the following  A little help with walking and/or transfers;A little help with bathing/dressing/bathroom;Assist for transportation;Help with stairs or ramp for entrance;Assistance with cooking/housework    Equipment Recommendations Rolling walker (2 wheels)  Recommendations for Other Services       Functional Status Assessment Patient has had a recent decline in their functional status and demonstrates the ability to make significant improvements in function in a reasonable and predictable amount of time.     Precautions / Restrictions Precautions Precautions: Fall Restrictions Weight Bearing Restrictions: No LLE Weight Bearing: Weight bearing as tolerated      Mobility  Bed Mobility Overal bed mobility: Needs Assistance Bed Mobility: Supine to Sit     Supine to sit: Min assist     General bed mobility comments: cues for sequence and use of R LE to self assist    Transfers Overall transfer level: Needs assistance Equipment used: Rolling walker (2 wheels) Transfers: Sit to/from Stand Sit to Stand: Min assist, Min guard            General transfer comment: cues for LE management and use of UEs to self assist    Ambulation/Gait Ambulation/Gait assistance: Min assist, Min guard Gait Distance (Feet): 130 Feet Assistive device: Rolling walker (2 wheels) Gait Pattern/deviations: Step-to pattern, Step-through pattern, Decreased step length - right, Decreased step length - left, Shuffle, Wide base of support       General Gait Details: cues for posture, position from RW and initial sequence  Stairs            Wheelchair Mobility    Modified Rankin (Stroke Patients Only)       Balance Overall balance assessment: Mild deficits observed, not formally tested                                           Pertinent Vitals/Pain Pain Assessment Pain Assessment: 0-10 Pain Score: 5  Pain Location: L hip Pain Descriptors / Indicators: Aching, Sore Pain Intervention(s): Limited activity within patient's tolerance, Monitored during session, Premedicated before session, Ice applied    Home Living Family/patient expects to be discharged to:: Private residence Living Arrangements: Parent Available Help at Discharge: Family;Available 24 hours/day Type of Home: House Home Access: Stairs to enter Entrance Stairs-Rails: Doctor, general practice of Steps: 6   Home Layout: One level Home Equipment: Cane - single point      Prior Function Prior Level of Function : Independent/Modified Independent             Mobility Comments: using cane       Hand Dominance  Extremity/Trunk Assessment   Upper Extremity Assessment Upper Extremity Assessment: Overall WFL for tasks assessed    Lower Extremity Assessment Lower Extremity Assessment: LLE deficits/detail    Cervical / Trunk Assessment Cervical / Trunk Assessment: Normal  Communication   Communication: No difficulties  Cognition Arousal/Alertness: Awake/alert Behavior During Therapy: WFL for tasks  assessed/performed, Impulsive Overall Cognitive Status: Within Functional Limits for tasks assessed                                          General Comments      Exercises Total Joint Exercises Ankle Circles/Pumps: AROM, Both, 15 reps, Supine   Assessment/Plan    PT Assessment Patient needs continued PT services  PT Problem List Decreased strength;Decreased range of motion;Decreased activity tolerance;Decreased balance;Decreased mobility;Decreased knowledge of use of DME;Pain       PT Treatment Interventions DME instruction;Stair training;Gait training;Functional mobility training;Therapeutic activities;Therapeutic exercise;Patient/family education    PT Goals (Current goals can be found in the Care Plan section)  Acute Rehab PT Goals Patient Stated Goal: Regain IND PT Goal Formulation: With patient Time For Goal Achievement: 05/26/22 Potential to Achieve Goals: Good    Frequency 7X/week     Co-evaluation               AM-PAC PT "6 Clicks" Mobility  Outcome Measure Help needed turning from your back to your side while in a flat bed without using bedrails?: A Little Help needed moving from lying on your back to sitting on the side of a flat bed without using bedrails?: A Little Help needed moving to and from a bed to a chair (including a wheelchair)?: A Little Help needed standing up from a chair using your arms (e.g., wheelchair or bedside chair)?: A Little Help needed to walk in hospital room?: A Little Help needed climbing 3-5 steps with a railing? : A Little 6 Click Score: 18    End of Session Equipment Utilized During Treatment: Gait belt Activity Tolerance: Patient tolerated treatment well Patient left: in chair;with call bell/phone within reach;with chair alarm set;with family/visitor present Nurse Communication: Mobility status PT Visit Diagnosis: Difficulty in walking, not elsewhere classified (R26.2)    Time: 0102-7253 PT Time  Calculation (min) (ACUTE ONLY): 23 min   Charges:   PT Evaluation $PT Eval Low Complexity: 1 Low PT Treatments $Gait Training: 8-22 mins        Mauro Kaufmann PT Acute Rehabilitation Services Pager [phone] Office [phone]   Patrycja Mumpower 05/19/2022, 5:14 PM

## 2022-05-19 NOTE — Anesthesia Preprocedure Evaluation (Addendum)
Anesthesia Evaluation  Patient identified by MRN, date of birth, ID band Patient awake    Reviewed: Allergy & Precautions, NPO status , Patient's Chart, lab work & pertinent test results  History of Anesthesia Complications Negative for: history of anesthetic complications  Airway Mallampati: II  TM Distance: >3 FB Neck ROM: Full    Dental no notable dental hx. (+) Teeth Intact, Dental Advisory Given   Pulmonary asthma , Current Smoker,    Pulmonary exam normal breath sounds clear to auscultation       Cardiovascular negative cardio ROS Normal cardiovascular exam Rhythm:Regular Rate:Normal     Neuro/Psych PSYCHIATRIC DISORDERS Bipolar Disorder Schizophrenia No medication 3 years doing wellnegative neurological ROS     GI/Hepatic negative GI ROS, Neg liver ROS,   Endo/Other  negative endocrine ROS  Renal/GU negative Renal ROS     Musculoskeletal  (+) Arthritis , Osteoarthritis,    Abdominal   Peds  Hematology negative hematology ROS (+)   Anesthesia Other Findings   Reproductive/Obstetrics                           Anesthesia Physical Anesthesia Plan  ASA: 3  Anesthesia Plan: Spinal   Post-op Pain Management: Celebrex PO (pre-op)* and Tylenol PO (pre-op)*   Induction: Intravenous  PONV Risk Score and Plan: 1 and Propofol infusion and Ondansetron  Airway Management Planned: Natural Airway  Additional Equipment:   Intra-op Plan:   Post-operative Plan:   Informed Consent:   Plan Discussed with: Anesthesiologist  Anesthesia Plan Comments:         Anesthesia Quick Evaluation

## 2022-05-19 NOTE — Transfer of Care (Signed)
Immediate Anesthesia Transfer of Care Note  Patient: Seth Kim  Procedure(s) Performed: Procedure(s): LEFT TOTAL HIP ARTHROPLASTY ANTERIOR APPROACH (Left)  Patient Location: PACU  Anesthesia Type:Spinal  Level of Consciousness:  sedated, patient cooperative and responds to stimulation  Airway & Oxygen Therapy:Patient Spontanous Breathing and Patient connected to face mask oxgen  Post-op Assessment:  Report given to PACU RN and Post -op Vital signs reviewed and stable  Post vital signs:  Reviewed and stable  Last Vitals:  Vitals:   05/19/22 0852  BP: (!) 150/106  Pulse: 70  Resp: 18  Temp: 36.4 C  SpO2: 98%    Complications: No apparent anesthesia complications

## 2022-05-19 NOTE — Brief Op Note (Signed)
05/19/2022  12:57 PM  PATIENT:  Zelma D Blann  41 y.o. male  PRE-OPERATIVE DIAGNOSIS:  LEFT HIP AVASCULAR NECROSIS  POST-OPERATIVE DIAGNOSIS:  LEFT HIP AVASCULAR NECROSIS  PROCEDURE:  Procedure(s): LEFT TOTAL HIP ARTHROPLASTY ANTERIOR APPROACH (Left)  SURGEON:  Surgeon(s) and Role:    * Kathryne Hitch, MD - Primary  PHYSICIAN ASSISTANT:  Rexene Edison, PA-C  ANESTHESIA:   spinal  EBL:  250 mL   COUNTS:  YES  DICTATION: .Other Dictation: Dictation Number [id number]  PLAN OF CARE: Admit for overnight observation  PATIENT DISPOSITION:  PACU - hemodynamically stable.   Delay start of Pharmacological VTE agent (>24hrs) due to surgical blood loss or risk of bleeding: no

## 2022-05-19 NOTE — Op Note (Signed)
NAME: Seth Kim, Seth Kim MEDICAL RECORD NO: [id number] ACCOUNT NO: [id number] DATE OF BIRTH: 1981-08-21 FACILITY: [facility]ATION: [id number] PHYSICIAN: Vanita Panda. Magnus Ivan, MD  Operative Report   DATE OF PROCEDURE: 05/19/2022  PREOPERATIVE DIAGNOSIS:  Avascular necrosis, left hip.  POSTOPERATIVE DIAGNOSIS:  Avascular necrosis, left hip.  PROCEDURE:  Left total hip arthroplasty through direct anterior approach.  IMPLANTS:  DePuy sector Gription acetabular component size 54, size 36+0 neutral polyethylene liner, size 5 ACTIS femoral component with high offset, size 36+8.5 ceramic hip ball.  SURGEON:  Vanita Panda. Magnus Ivan MD  ASSISTANT:  Richardean Canal, PA-C  ANESTHESIA:  Spinal.  ANTIBIOTICS:  2 g IV Ancef and 1 g intravenous vancomycin.  ESTIMATED BLOOD LOSS:  250 mL  COMPLICATIONS:  None.  INDICATIONS:  The patient is a 41 year old gentleman with rapidly worsening avascular necrosis involving his left hip.  He had some work-related accident or injury that happened in February of this year.  MRI was obtained of his hip showing avascular  necrosis of his left hip.  There is a nidus of AVN on the right hip, but he is asymptomatic with that one.  He has minimal pain.  His pain has become quite severe on the left hip and given the end-stage avascular necrosis of the left hip, our only  recommendation is a hip replacement at this standpoint.  We did explain in detail the risk of acute blood loss anemia, nerve or vessel injury, fracture, infection, dislocation, DVT, implant failure and skin and soft tissue issues.  He understands our  goals are to decrease pain, improve mobility and overall improve quality of life.  DESCRIPTION OF PROCEDURE:  After informed consent was obtained, appropriate left hip was marked.  He was brought to the operating room and sat up on the stretcher where spinal anesthesia was obtained.  He was laid in supine position on the stretcher.   Foley catheter was placed  and traction boots were placed on both his feet.  He was then placed supine on the Hana fracture table, the perineal post in place and both legs in line with skeletal traction, but no traction applied.  I then noticed a Band-Aid  on his abdomen that we had not seen before made aware of.  We did find a boil on his abdomen where he had some purulent drainage.  He let us know that has been going on for about a month. Given the fact that he has had spinal anesthesia his antibiotics  and TXA we felt it was appropriate to proceed with surgery, but I did unroof the blister or the boil and was able to clean this area before we prepped and draped and got this draped and prepped out of the field.  We placed a Mepilex dressing over that  boil area after cleaning with Betadine, alcohol and again cleaning up very well.  We then prepped his left hip with DuraPrep and sterile drapes.  A timeout was called and he was identified correct patient, correct left hip.  I then made an incision just  inferior and posterior to the anterior superior iliac spine and carried this obliquely down the leg.  We dissected down tensor fascia lata muscle.  Tensor fascia was then divided longitudinally to proceed with direct anterior approach to the hip.  We  identified and cauterized circumflex vessels.  We then identified the hip capsule, opened the hip capsule in L-type format finding a moderate joint effusion.  We placed curved retractors around the medial and  lateral femoral neck and made a femoral neck  cut with an oscillating saw, proximal to the lesser trochanter and completed this with an osteotome.  I placed a corkscrew guide in the femoral head and removed the femoral head and found a wide area where avascular necrosis was causing a big flap of  cartilage to break off.  I then placed a bent Hohmann over the medial acetabular rim and removed remnants of acetabular labrum and other debris.  I then began reaming under direct  visualization from a size 43 reamer in a stepwise increments going up to a  size 53 reamer with all reamers placed under direct visualization.  Last reamer was placed under direct fluoroscopy, so I could obtain my depth of reaming my inclination and anteversion.  I then placed real DePuy sector Gription acetabular component  size 54 and we went with a 36+0 neutral polyethylene liner.  Attention was then turned to the femur.  With the leg externally rotated to 120 degrees and extended and adducted we were able to place a Mueller retractor medially and Hohmann retractor behind  the greater trochanter.  We released lateral joint capsule and used a box cutting osteotome to enter femoral canal and a rongeur to lateralize, I then began broaching using the ACTIS broaching system from a size 0 going up to a size 5.  With a size 5 in  place, we trialed a high offset femoral neck based off his anatomy and a 36+1.5 hip ball, reduced this in the acetabulum and based on radiographic assessment we knew we needed a more leg length.  We dislocated the hip, removed the trial components.  We  placed the real ACTIS femoral component, size 5.  With a high offset we went with a real 36+8.5 metal hip ball and reduced this in acetabulum.  We achieved his normal offset and range of motion was stable.  His leg lengths were also equal.  We then  irrigated the soft tissue with normal saline solution using pulsatile lavage.  We closed the joint capsule with interrupted #1 Ethibond suture followed by #1 Vicryl to close the tensor fascia.  0 Vicryl was used to close deep tissue and 2-0 Vicryl was  used to close the subcutaneous tissue.  The skin was closed with staples.  An Aquacel dressing was applied.  He was taken off the Hana table and taken to recovery room in stable condition with all final counts being correct.  No complications noted.  Of  note, Rexene Edison, PA-C, assisted during the entire case and assistance was crucial for  facilitating all aspects of this case including soft tissue management and retracting as well as helping guide implant placement and layered closure of the wound.  His  assistance was medically necessary.   PUS D: 05/19/2022 12:55:54 pm T: 05/19/2022 6:17:00 pm  JOB: [id number]/ [id number]

## 2022-05-19 NOTE — Anesthesia Procedure Notes (Signed)
Spinal  Patient location during procedure: OR Start time: 05/19/2022 11:33 AM End time: 05/19/2022 11:39 AM Reason for block: surgical anesthesia Staffing Performed: anesthesiologist  Anesthesiologist: Heather Roberts, MD Performed by: Heather Roberts, MD Authorized by: Heather Roberts, MD   Preanesthetic Checklist Completed: patient identified, IV checked, risks and benefits discussed, surgical consent, monitors and equipment checked, pre-op evaluation and timeout performed Spinal Block Patient position: sitting Prep: DuraPrep Patient monitoring: cardiac monitor, continuous pulse ox and blood pressure Approach: midline Location: L2-3 Injection technique: single-shot Needle Needle type: Pencan  Needle gauge: 24 G Needle length: 9 cm Assessment Events: CSF return Additional Notes Functioning IV was confirmed and monitors were applied. Sterile prep and drape, including hand hygiene and sterile gloves were used. The patient was positioned and the spine was prepped. The skin was anesthetized with lidocaine.  Free flow of clear CSF was obtained prior to injecting local anesthetic into the CSF.  The spinal needle aspirated freely following injection.  The needle was carefully withdrawn.  The patient tolerated the procedure well.

## 2022-05-19 NOTE — Anesthesia Postprocedure Evaluation (Signed)
Anesthesia Post Note  Patient: Whitt D Coglianese  Procedure(s) Performed: LEFT TOTAL HIP ARTHROPLASTY ANTERIOR APPROACH (Left: Hip)     Patient location during evaluation: Nursing Unit Anesthesia Type: Spinal Level of consciousness: oriented and awake and alert Pain management: pain level controlled Vital Signs Assessment: post-procedure vital signs reviewed and stable Respiratory status: spontaneous breathing and respiratory function stable Cardiovascular status: blood pressure returned to baseline and stable Postop Assessment: no headache, no backache, no apparent nausea or vomiting and patient able to bend at knees Anesthetic complications: no   No notable events documented.  Last Vitals:  Vitals:   05/19/22 1430 05/19/22 1449  BP: 108/63 107/71  Pulse: 61 67  Resp: 14 16  Temp:  36.7 C  SpO2: 97% 97%    Last Pain:  Vitals:   05/19/22 1449  TempSrc: Oral  PainSc: 0-No pain                 Trevor Iha

## 2022-05-20 DIAGNOSIS — M1612 Unilateral primary osteoarthritis, left hip: Secondary | ICD-10-CM | POA: Diagnosis not present

## 2022-05-20 LAB — BASIC METABOLIC PANEL
Anion gap: 8 (ref 5–15)
BUN: 14 mg/dL (ref 6–20)
CO2: 24 mmol/L (ref 22–32)
Calcium: 8.7 mg/dL — ABNORMAL LOW (ref 8.9–10.3)
Chloride: 100 mmol/L (ref 98–111)
Creatinine, Ser: 0.83 mg/dL (ref 0.61–1.24)
GFR, Estimated: >60 mL/min (ref >60)
Glucose, Bld: 271 mg/dL — ABNORMAL HIGH (ref 70–99)
Potassium: 4 mmol/L (ref 3.5–5.1)
Sodium: 132 mmol/L — ABNORMAL LOW (ref 135–145)

## 2022-05-20 LAB — CBC
HCT: 42.5 % (ref 39.0–52.0)
Hemoglobin: 14.2 g/dL (ref 13.0–17.0)
MCH: 32.1 pg (ref 26.0–34.0)
MCHC: 33.4 g/dL (ref 30.0–36.0)
MCV: 96.2 fL (ref 80.0–100.0)
Platelets: 247 10*3/uL (ref 150–400)
RBC: 4.42 MIL/uL (ref 4.22–5.81)
RDW: 12 % (ref 11.5–15.5)
WBC: 16.6 10*3/uL — ABNORMAL HIGH (ref 4.0–10.5)
nRBC: 0 % (ref 0.0–0.2)

## 2022-05-20 MED ORDER — OXYCODONE HCL 5 MG PO TABS: 5.0000 mg | ORAL_TABLET | Freq: Four times a day (QID) | ORAL | 0 refills | Status: DC | PRN

## 2022-05-20 MED ORDER — ASPIRIN 81 MG PO CHEW: 81.0000 mg | CHEWABLE_TABLET | Freq: Two times a day (BID) | ORAL | 0 refills | Status: DC

## 2022-05-20 MED ORDER — METHOCARBAMOL 500 MG PO TABS: 500.0000 mg | ORAL_TABLET | Freq: Four times a day (QID) | ORAL | 1 refills | Status: DC | PRN

## 2022-05-20 MED ORDER — DOXYCYCLINE HYCLATE 100 MG PO TABS: 100.0000 mg | ORAL_TABLET | Freq: Two times a day (BID) | ORAL | 0 refills | Status: DC

## 2022-05-20 NOTE — Discharge Instructions (Signed)

## 2022-05-20 NOTE — Progress Notes (Signed)
Physical Therapy Treatment Patient Details Name: Seth Kim MRN: [id number] DOB: May 07, 1981 Today's Date: 05/20/2022   History of Present Illness Pt s/p L THR and with hx of bipolar and schizoprenia    PT Comments    Pt tolerated session well this morning. Mobility is at a supervision level with ambulation, bed and tranfers with RW. Educated pt with safety with RW to utilize at least for first week for safety and establishing a normal gait pattern, to get up and do small walks in house frequently, exercise program and handout reviewed and completed, and performed up / down steps. All education complete, pt ready for DC from our standpoint. Waiting on ride and RW.     Recommendations for follow up therapy are one component of a multi-disciplinary discharge planning process, led by the attending physician.  Recommendations may be updated based on patient status, additional functional criteria and insurance authorization.  Follow Up Recommendations  Follow physician's recommendations for discharge plan and follow up therapies     Assistance Recommended at Discharge Intermittent Supervision/Assistance  Patient can return home with the following A little help with walking and/or transfers;A little help with bathing/dressing/bathroom;Assist for transportation;Help with stairs or ramp for entrance;Assistance with cooking/housework   Equipment Recommendations  Rolling walker (2 wheels)    Recommendations for Other Services       Precautions / Restrictions Precautions Precautions: Fall Restrictions Weight Bearing Restrictions: No LLE Weight Bearing: Weight bearing as tolerated     Mobility  Bed Mobility Overal bed mobility: Needs Assistance, Modified Independent Bed Mobility: Supine to Sit     Supine to sit: Modified independent (Device/Increase time)          Transfers Overall transfer level: Needs assistance Equipment used: Rolling walker (2 wheels) Transfers: Sit  to/from Stand Sit to Stand: Supervision           General transfer comment: pt popped up from sittin on edge of bed with no use of RW. Educated for safety to please use RW for at least the next week to help with normal progresion and safety    Ambulation/Gait Ambulation/Gait assistance: Supervision Gait Distance (Feet): 150 Feet Assistive device: Rolling walker (2 wheels) Gait Pattern/deviations: Step-through pattern       General Gait Details: step to pattern with slight flexed on L hip and L knee area with stance phase Educated to stand upright to help achieve neutral extension in L hip area. Also educated wih step to patern for gettting out of car at home to walk across he grass wih RW and step o patern to be careful on uneven surfaces   Stairs             Wheelchair Mobility    Modified Rankin (Stroke Patients Only)       Balance Overall balance assessment: Mild deficits observed, not formally tested (no balance issues during session today)                                          Cognition Arousal/Alertness: Awake/alert Behavior During Therapy: WFL for tasks assessed/performed, Impulsive Overall Cognitive Status: Within Functional Limits for tasks assessed                                          Exercises Total  Joint Exercises Ankle Circles/Pumps: AROM, Both, 15 reps, Supine Quad Sets: AROM, Supine, Both, 15 reps Heel Slides: AROM, Left, 10 reps, Supine Hip ABduction/ADduction: AROM, Supine, Left, 10 reps    General Comments        Pertinent Vitals/Pain Pain Assessment Pain Score: 2  Pain Location: L hip Pain Descriptors / Indicators: Aching, Sore Pain Intervention(s): Ice applied    Home Living                          Prior Function            PT Goals (current goals can now be found in the care plan section) Acute Rehab PT Goals Patient Stated Goal: Regain IND PT Goal Formulation: With  patient Time For Goal Achievement: 05/26/22 Potential to Achieve Goals: Good Progress towards PT goals: Progressing toward goals    Frequency    7X/week      PT Plan Current plan remains appropriate    Co-evaluation              AM-PAC PT "6 Clicks" Mobility   Outcome Measure  Help needed turning from your back to your side while in a flat bed without using bedrails?: None Help needed moving from lying on your back to sitting on the side of a flat bed without using bedrails?: None Help needed moving to and from a bed to a chair (including a wheelchair)?: None Help needed standing up from a chair using your arms (e.g., wheelchair or bedside chair)?: None Help needed to walk in hospital room?: A Little Help needed climbing 3-5 steps with a railing? : A Little 6 Click Score: 22    End of Session Equipment Utilized During Treatment: Gait belt Activity Tolerance: Patient tolerated treatment well Patient left: in chair;with call bell/phone within reach (educated to call before he gets up and pt agreed) Nurse Communication: Mobility status PT Visit Diagnosis: Difficulty in walking, not elsewhere classified (R26.2)     Time: 0900-0930 PT Time Calculation (min) (ACUTE ONLY): 30 min  Charges:  $Gait Training: 8-22 mins $Therapeutic Exercise: 8-22 mins                     Abron Neddo, PT, MPT Acute Rehabilitation Services Office: [phone] Pager: [phone] 05/20/2022    Kandyce Dieguez, Clois Dupes 05/20/2022, 10:08 AM

## 2022-05-20 NOTE — Plan of Care (Signed)
Discharge instructions given to the patient including medications. ?

## 2022-05-20 NOTE — Plan of Care (Signed)
  Problem: Coping: Goal: Level of anxiety will decrease Outcome: Progressing   Problem: Pain Managment: Goal: General experience of comfort will improve Outcome: Progressing   

## 2022-05-20 NOTE — Progress Notes (Signed)
Subjective: 1 Day Post-Op Procedure(s) (LRB): LEFT TOTAL HIP ARTHROPLASTY ANTERIOR APPROACH (Left) Patient reports pain as 4 on 0-10 scale.   Progressing well with PT. No complaints.  Objective: Vital signs in last 24 hours: Temp:  [97.6 F (36.4 C)-98 F (36.7 C)] 97.7 F (36.5 C) (06/17 0645) Pulse Rate:  [61-84] 79 (06/17 0645) Resp:  [13-18] 18 (06/17 0645) BP: (102-130)/(63-90) 127/89 (06/17 0645) SpO2:  [96 %-100 %] 99 % (06/17 0645)  Intake/Output from previous day: 06/16 0701 - 06/17 0700 In: 2950 [P.O.:600; I.V.:2300; IV Piggyback:50] Out: 3450 [Urine:3200; Blood:250] Intake/Output this shift: No intake/output data recorded.  Recent Labs    05/20/22 0328  HGB 14.2   Recent Labs    05/20/22 0328  WBC 16.6*  RBC 4.42  HCT 42.5  PLT 247   Recent Labs    05/20/22 0328  NA 132*  K 4.0  CL 100  CO2 24  BUN 14  CREATININE 0.83  GLUCOSE 271*  CALCIUM 8.7*   No results for input(s): "LABPT", "INR" in the last 72 hours.  Sensation intact distally Intact pulses distally Dorsiflexion/Plantar flexion intact Incision: no drainage Compartment soft   Assessment/Plan: 1 Day Post-Op Procedure(s) (LRB): LEFT TOTAL HIP ARTHROPLASTY ANTERIOR APPROACH (Left) Up with therapy Possible discharge to home later today if patient does well with PT and remains stable.      Harvest Stanco 05/20/2022, 8:55 AM

## 2022-05-20 NOTE — Discharge Summary (Signed)
Patient ID: Seth Kim MRN: [id number] DOB/AGE: 1981-01-26 41 y.o.  Admit date: 05/19/2022 Discharge date: 05/20/2022  Admission Diagnoses:  Principal Problem:   Avascular necrosis of bone of left hip (HCC) Active Problems:   Status post left hip replacement   Discharge Diagnoses:  Status post left total hip arthroplasty Boil abdomen  Past Medical History:  Diagnosis Date   Asthma    Bipolar disorder, unspecified (HCC) 2015   Schizophrenia (HCC)     Surgeries: Procedure(s): LEFT TOTAL HIP ARTHROPLASTY ANTERIOR APPROACH on 05/19/2022   Consultants:   Discharged Condition: Improved  Hospital Course: VALON GLASSCOCK is an 41 y.o. male who was admitted 05/19/2022 for operative treatment ofAvascular necrosis of bone of left hip (HCC). Patient has severe unremitting pain that affects sleep, daily activities, and work/hobbies. After pre-op clearance the patient was taken to the operating room on 05/19/2022 and underwent  Procedure(s): LEFT TOTAL HIP ARTHROPLASTY ANTERIOR APPROACH.    Patient was given perioperative antibiotics:  Anti-infectives (From admission, onward)    Start     Dose/Rate Route Frequency Ordered Stop   05/20/22 0000  doxycycline (VIBRA-TABS) 100 MG tablet        100 mg Oral Every 12 hours 05/20/22 0900     05/19/22 2200  doxycycline (VIBRA-TABS) tablet 100 mg        100 mg Oral Every 12 hours 05/19/22 1720     05/19/22 1800  ceFAZolin (ANCEF) IVPB 1 g/50 mL premix        1 g 100 mL/hr over 30 Minutes Intravenous Every 6 hours 05/19/22 1443 05/19/22 2325   05/19/22 1202  vancomycin (VANCOCIN) 1-5 GM/200ML-% IVPB       Note to Pharmacy: Evern Bio K: cabinet override      05/19/22 1202 05/20/22 0014   05/19/22 0848  ceFAZolin (ANCEF) 2-4 GM/100ML-% IVPB       Note to Pharmacy: Vevelyn Royals D: cabinet override      05/19/22 0848 05/19/22 2059   05/19/22 0845  vancomycin (VANCOCIN) IVPB 1000 mg/200 mL premix  Status:  Discontinued        1,000  mg 200 mL/hr over 60 Minutes Intravenous On call to O.R. 05/19/22 6734 05/19/22 0836   05/19/22 0845  vancomycin (VANCOREADY) IVPB 1500 mg/300 mL  Status:  Discontinued        1,500 mg 150 mL/hr over 120 Minutes Intravenous On call to O.R. 05/19/22 1937 05/19/22 1404        Patient was given sequential compression devices, early ambulation, and chemoprophylaxis to prevent DVT.  Patient benefited maximally from hospital stay and there were no complications.    Recent vital signs: Patient Vitals for the past 24 hrs:  BP Temp Temp src Pulse Resp SpO2  05/20/22 0645 127/89 97.7 F (36.5 C) Oral 79 18 99 %  05/20/22 0149 124/76 97.7 F (36.5 C) Oral 84 18 97 %  05/19/22 2105 130/90 98 F (36.7 C) Oral 83 18 97 %  05/19/22 1449 107/71 98 F (36.7 C) Oral 67 16 97 %  05/19/22 1430 108/63 -- -- 61 14 97 %  05/19/22 1415 102/67 -- -- 62 16 98 %  05/19/22 1400 105/76 -- -- 71 14 96 %  05/19/22 1345 109/77 97.6 F (36.4 C) -- 65 13 96 %  05/19/22 1338 -- -- -- 69 16 97 %  05/19/22 1330 109/78 -- -- 61 14 100 %  05/19/22 1319 115/79 97.6 F (36.4 C) -- 71 17 100 %  Recent laboratory studies:  Recent Labs    05/20/22 0328  WBC 16.6*  HGB 14.2  HCT 42.5  PLT 247  NA 132*  K 4.0  CL 100  CO2 24  BUN 14  CREATININE 0.83  GLUCOSE 271*  CALCIUM 8.7*     Discharge Medications:   Allergies as of 05/20/2022       Reactions   Penicillins Other (See Comments)   unknown        Medication List     STOP taking these medications    acetaminophen-codeine 300-30 MG tablet Commonly known as: TYLENOL #3   ibuprofen 200 MG tablet Commonly known as: ADVIL   naproxen 500 MG tablet Commonly known as: NAPROSYN   tiZANidine 4 MG tablet Commonly known as: ZANAFLEX       TAKE these medications    aspirin 81 MG chewable tablet Chew 1 tablet (81 mg total) by mouth 2 (two) times daily.   doxycycline 100 MG tablet Commonly known as: VIBRA-TABS Take 1 tablet (100 mg  total) by mouth every 12 (twelve) hours.   methocarbamol 500 MG tablet Commonly known as: ROBAXIN Take 1 tablet (500 mg total) by mouth every 6 (six) hours as needed for muscle spasms.   oxyCODONE 5 MG immediate release tablet Commonly known as: Oxy IR/ROXICODONE Take 1-2 tablets (5-10 mg total) by mouth every 6 (six) hours as needed for moderate pain (pain score 4-6).               Durable Medical Equipment  (From admission, onward)           Start     Ordered   05/19/22 1444  DME 3 n 1  Once        05/19/22 1443   05/19/22 1444  DME Walker rolling  Once       Question Answer Comment  Walker: With 5 Inch Wheels   Patient needs a walker to treat with the following condition Status post total replacement of left hip      05/19/22 1443            Diagnostic Studies: DG Pelvis Portable  Result Date: 05/19/2022 CLINICAL DATA:  Status post total left hip arthroplasty. EXAM: PORTABLE PELVIS 1-2 VIEWS COMPARISON:  Pelvis and left hip radiographs 01/19/2022 FINDINGS: Interval total left hip arthroplasty. No perihardware lucency is seen to indicate hardware failure or loosening. Expected postoperative changes including lateral left hip subcutaneous air soft tissue swelling and surgical skin staples. No acute fracture or dislocation. Mild-to-moderate right femoroacetabular joint space narrowing, subchondral sclerosis subchondral cystic change and peripheral osteophytosis. IMPRESSION: Interval total left hip arthroplasty without evidence of hardware failure. Electronically Signed   By: Neita Garnet M.D.   On: 05/19/2022 13:41   DG HIP UNILAT WITH PELVIS 1V LEFT  Result Date: 05/19/2022 CLINICAL DATA:  Left total hip arthroplasty EXAM: DG HIP (WITH OR WITHOUT PELVIS) 1V*L* COMPARISON:  11/18/2022 FINDINGS: Three fluoroscopic images are obtained during the performance of the procedure and are provided for interpretation only. Images demonstrate left hip prosthesis in near anatomic  alignment. No perihardware lucency or fracture. Fluoroscopy time: 19 seconds 3.96 mGy IMPRESSION: Intraoperative imaging of left total hip arthroplasty. Electronically Signed   By: Wiliam Ke M.D.   On: 05/19/2022 13:09   DG C-Arm 1-60 Min-No Report  Result Date: 05/19/2022 Fluoroscopy was utilized by the requesting physician.  No radiographic interpretation.   DG C-Arm 1-60 Min-No Report  Result Date: 05/19/2022 Fluoroscopy was  utilized by the requesting physician.  No radiographic interpretation.    Disposition: Discharge disposition: 06-Home-Health Care Svc          Follow-up Information     Kathryne Hitch, MD Follow up in 2 week(s).   Specialty: Orthopedic Surgery Contact information: [address] [place] [address] [phone]                  Signed: Richardean Canal 05/20/2022, 9:03 AM

## 2022-05-20 NOTE — TOC Transition Note (Signed)
Transition of Care [facility]) - CM/SW Discharge Note   Patient Details  Name: Seth Kim MRN: [id number] Date of Birth: 1981-04-14  Transition of Care [facility] CM/SW Contact:  Darleene Cleaver, LCSW Phone Number: 05/20/2022, 11:39 AM   Clinical Narrative:    Home health was prearranged with [facility].  Patient will be going home with home health PT through [facility].  CSW signing off please reconsult with any other social work needs, home health agency has been notified of planned discharge.  [facility] will be delivering rolling walker before he discharges back home.    Final next level of care: Home w Home Health Services Barriers to Discharge: Barriers Resolved   Patient Goals and CMS Choice Patient states their goals for this hospitalization and ongoing recovery are:: To return back home. CMS Medicare.gov Compare Post Acute Care list provided to:: Patient Choice offered to / list presented to : Patient  Discharge Placement                       Discharge Plan and Services                DME Arranged: Walker rolling DME Agency: [facility] Date DME Agency Contacted: 05/20/22 Time DME Agency Contacted: [phone] Representative spoke with at DME Agency: Leavy Cella HH Arranged: PT HH Agency: [facility] Date [facility] Agency Contacted: 05/16/22 Time HH Agency Contacted: [phone] Representative spoke with at [facility] Agency: Clifton Custard  Social Determinants of Health (SDOH) Interventions     Readmission Risk Interventions     No data to display

## 2022-05-22 ENCOUNTER — Encounter (HOSPITAL_COMMUNITY): Payer: Self-pay | Admitting: Orthopaedic Surgery

## 2022-06-01 ENCOUNTER — Encounter: Payer: Self-pay | Admitting: Orthopaedic Surgery

## 2022-06-01 ENCOUNTER — Ambulatory Visit (INDEPENDENT_AMBULATORY_CARE_PROVIDER_SITE_OTHER): Payer: 59 | Admitting: Orthopaedic Surgery

## 2022-06-01 DIAGNOSIS — Z96642 Presence of left artificial hip joint: Secondary | ICD-10-CM

## 2022-06-01 MED ORDER — OXYCODONE HCL 5 MG PO TABS: 5.0000 mg | ORAL_TABLET | Freq: Four times a day (QID) | ORAL | 0 refills | Status: DC | PRN

## 2022-06-01 NOTE — Progress Notes (Signed)
The patient is 2 weeks tomorrow status post a left total hip arthroplasty.  He is ambulating with a cane.  He says his range of motion and strength are improving and he is doing well overall.  He has been compliant with a baby aspirin twice a day.  He does need a refill on pain medications.  He does seem to be ambulating well.  The staples are removed from his left hip incision and Steri-Strips applied.  He has good range of motion overall.  We will see him back in 4 weeks to see how he is doing overall but no x-rays are needed.  He can stop his aspirin.  I did refill his pain medication.

## 2022-06-29 ENCOUNTER — Other Ambulatory Visit: Payer: Self-pay | Admitting: Physician Assistant

## 2022-07-10 ENCOUNTER — Ambulatory Visit (INDEPENDENT_AMBULATORY_CARE_PROVIDER_SITE_OTHER): Payer: 59 | Admitting: Physician Assistant

## 2022-07-10 ENCOUNTER — Telehealth: Payer: Self-pay | Admitting: Physician Assistant

## 2022-07-10 ENCOUNTER — Encounter: Payer: Self-pay | Admitting: Physician Assistant

## 2022-07-10 DIAGNOSIS — Z96642 Presence of left artificial hip joint: Secondary | ICD-10-CM

## 2022-07-10 NOTE — Telephone Encounter (Signed)
Work note completed.

## 2022-07-10 NOTE — Telephone Encounter (Signed)
Sent to pt's mychart called and advised pt

## 2022-07-10 NOTE — Telephone Encounter (Signed)
Patient called. Says he returns to work 8/14. Would like a work note to return. [phone] is his number

## 2022-07-10 NOTE — Progress Notes (Signed)
HPI: Mr. Mcentee returns today status post left total hip arthroplasty 05/19/2022.  He states he is 90% better.  He is taking no pain medications just some ibuprofen occasionally mostly at night.  He is using no assistive device to ambulate.  He denies any fevers chills.  Overall doing well.  Physical exam: General well-developed well-nourished male who ambulates without any assistive device. Left hip: Good range of motion.  Calf supple nontender.  Dorsiflexion plantarflexion left ankle intact.  Surgical incisions well-healed no signs of infection or dehiscence.  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  Per his request we will have him return to work on 07/24/2022 with the only restriction of no lifting greater than 50 pounds.  Questions were encouraged and answered at length.  Follow-up with [place] sooner if there is any questions or concerns.

## 2022-07-14 ENCOUNTER — Telehealth: Payer: Self-pay | Admitting: Physician Assistant

## 2022-07-14 NOTE — Telephone Encounter (Signed)
Patient came in to drop off return to work form that needs to be filled out also he would like a extra copy of the completed document when he picks it up. Form will be on his desk with patient name and Seth Kim name on it.

## 2022-07-17 ENCOUNTER — Telehealth: Payer: Self-pay | Admitting: Orthopaedic Surgery

## 2022-07-17 NOTE — Telephone Encounter (Signed)
Completed and faxed. Lvm informing pt

## 2022-07-17 NOTE — Telephone Encounter (Signed)
Is this ok?

## 2022-07-17 NOTE — Telephone Encounter (Signed)
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 The number to contact patient is [phone]

## 2022-07-18 NOTE — Telephone Encounter (Signed)
Completed and faxed. Sent pt portal message informing pt

## 2022-11-09 ENCOUNTER — Ambulatory Visit (INDEPENDENT_AMBULATORY_CARE_PROVIDER_SITE_OTHER): Payer: 59 | Admitting: Physician Assistant

## 2022-11-09 ENCOUNTER — Encounter: Payer: Self-pay | Admitting: Physician Assistant

## 2022-11-09 ENCOUNTER — Ambulatory Visit (INDEPENDENT_AMBULATORY_CARE_PROVIDER_SITE_OTHER): Payer: 59

## 2022-11-09 DIAGNOSIS — Z96642 Presence of left artificial hip joint: Secondary | ICD-10-CM

## 2022-11-09 NOTE — Progress Notes (Signed)
HPI: Seth Kim returns today status post left total hip arthroplasty 05/19/2022.  He states overall he is doing well.  He is taking no pain medications.  He has no pain in the left hip.  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  He is back at work full duties.  Review of systems: See HPI otherwise negative  Physical exam: General well-developed well-nourished male who ambulates without any assistive device.  He gets on and off the exam table on his own. Left hip: Good range of motion without pain.  Left calf supple nontender dorsiflexion plantarflexion left ankle intact.   Radiographs: AP pelvis and lateral views of the left hip: Left total hip arthroplasty components well-seated.  No acute fractures.  Both hips are well located.  No acute findings.  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  Plan: He will follow-up with [place] in June 2024 for AP pelvis lateral view of the left hip.  Follow-up sooner if there is any questions concerns.  Questions were encouraged and answered.

## 2023-02-08 ENCOUNTER — Encounter: Payer: Self-pay | Admitting: Radiology

## 2023-05-25 ENCOUNTER — Ambulatory Visit (HOSPITAL_COMMUNITY)
Admission: EM | Admit: 2023-05-25 | Discharge: 2023-05-25 | Disposition: A | Discharge status: Home / Self Care | Payer: 59 | Attending: Emergency Medicine | Admitting: Emergency Medicine

## 2023-05-25 ENCOUNTER — Ambulatory Visit (HOSPITAL_COMMUNITY): Admission: EM | Admit: 2023-05-25 | Discharge: 2023-05-25 | Discharge status: Against Medical Advice | Payer: 59

## 2023-05-25 ENCOUNTER — Encounter (HOSPITAL_COMMUNITY): Payer: Self-pay

## 2023-05-25 DIAGNOSIS — K029 Dental caries, unspecified: Secondary | ICD-10-CM | POA: Diagnosis not present

## 2023-05-25 MED ORDER — CLINDAMYCIN HCL 150 MG PO CAPS: 150.0000 mg | ORAL_CAPSULE | Freq: Four times a day (QID) | ORAL | 0 refills | Status: AC

## 2023-05-25 MED ORDER — ACETAMINOPHEN 500 MG PO TABS: 500.0000 mg | ORAL_TABLET | Freq: Four times a day (QID) | ORAL | 0 refills | Status: AC | PRN

## 2023-05-25 MED ORDER — ACETAMINOPHEN 325 MG PO TABS
650.0000 mg | ORAL_TABLET | Freq: Once | ORAL | Status: AC
Start: 2023-05-25 — End: 2023-05-25
  Administered 2023-05-25: 650 mg via ORAL

## 2023-05-25 MED ORDER — ACETAMINOPHEN 325 MG PO TABS
ORAL_TABLET | ORAL | Status: AC
  Filled 2023-05-25: qty 2

## 2023-05-25 NOTE — Discharge Instructions (Addendum)
You have extensive dental erosion causing your dental pain.  I am covering you with antibiotics for potential infection, please take them as prescribed and until finished.  You can take them with food to prevent gastrointestinal upset.  It is important not to take too much ibuprofen or Tylenol, as this can damage your kidneys or liver.  For pain and swelling take 800 mg of ibuprofen up to every 8 hours, you can alternate with 500 mg of Tylenol as needed, alternating the two every 4-6 hours.   It is important that you receive further evaluation with a dentist, as these teeth may need to be removed.  Below are some dental resources.  Please return to clinic or seek follow-up care if you develop fever, oral swelling, return of dental pain despite antibiotics.  [facility] [place] Address: [address] [place], [place], [place] [address] Phone: [phone]  [facility] [phone] extension [phone] [address]  Dr. Lawrence Marseilles [phone] [address]  [place] [phone] [address] [facility] [place].  [facility] [phone] extension [phone] [place], [place], [address] First come first serve for the first 10 clients.  May do simple extractions only, no wisdom teeth or surgery.  You may try the second for Thursday of the month starting at 6:30 AM.  [facility] You may call the school to see if they are still helping to provide dental care for emergent cases.

## 2023-05-25 NOTE — ED Notes (Signed)
Informed by front desk staff that Patient left the premises. Patient called from the lobby with no response.

## 2023-05-25 NOTE — ED Provider Notes (Signed)
[facility]    CSN: [id number] Arrival date & time: 05/25/23  8657      History   Chief Complaint Chief Complaint  Patient presents with   Dental Pain    HPI Seth Kim is a 42 y.o. male.   Patient presents to clinic for right lower dental pain that started yesterday.  Pain has been intermittent and ongoing, has known dental caries and dental erosion.  Does not currently have a dentist, is waiting for dental insurance with his current job.  For pain control, he took 7 200 mg ibuprofen prior to arrival at the clinic.  He denies any fevers, oral swelling or trouble swallowing.  Denies any abscess or drainage.  Does have dental tenderness and is unable to chew on this side.    The history is provided by the patient and medical records.  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/15/2022   Elevated transaminase level    AKI (acute kidney injury) (HCC) 01/24/2015   Lactic acidosis 01/24/2015   Leukocytosis 01/24/2015   Abnormal transaminases 01/24/2015   Altered mental status 01/23/2015   Paranoid delusion (HCC) 01/23/2015    Past Surgical History:  Procedure Laterality Date   NO PAST SURGERIES     TOTAL HIP ARTHROPLASTY Left 05/19/2022   Procedure: LEFT TOTAL HIP ARTHROPLASTY ANTERIOR APPROACH;  Surgeon: Kathryne Hitch, MD;  Location: [facility];  Service: Orthopedics;  Laterality: Left;       Home Medications    Prior to Admission medications   Medication Sig Start Date End Date Taking? Authorizing Provider  acetaminophen (TYLENOL) 500 MG tablet Take 1 tablet (500 mg total) by mouth every 6 (six) hours as needed. 05/25/23   Yes Rinaldo Ratel, [place] N, FNP  clindamycin (CLEOCIN) 150 MG capsule Take 1 capsule (150 mg total) by mouth every 6 (six) hours for 7 days. 05/25/23 06/01/23 Yes Rinaldo Ratel, [place] N, FNP  risperiDONE (RISPERDAL M-TABS) 0.5 MG disintegrating tablet Take 1 tablet (0.5 mg total) by mouth 2 (two) times daily. Patient not taking: Reported on 06/12/2020 01/28/15 06/12/20  [name], MD    Family History Family History  Problem Relation Age of Onset   Asthma Father     Social History Social History   Tobacco Use   Smoking status: Some Days   Smokeless tobacco: Never  Vaping Use   Vaping Use: Never used  Substance Use Topics   Alcohol use: Yes    Comment: not past drink, usually few beers a day   Drug use: Not Currently    Types: Cocaine, Marijuana    Comment: Per labs      Allergies   Penicillins   Review of Systems Review of Systems  Constitutional:  Negative for fever.  HENT:  Positive for dental problem. Negative for trouble swallowing and voice change.      Physical Exam Triage Vital Signs ED Triage Vitals  Enc Vitals Group     BP 05/25/23 0946 (!) 133/94     Pulse Rate 05/25/23 0946 71     Resp 05/25/23 0946 17     Temp 05/25/23  0946 98 F (36.7 C)     Temp Source 05/25/23 0946 Oral     SpO2 05/25/23 0946 97 %     Weight 05/25/23 0946 210 lb (95.3 kg)     Height 05/25/23 0946 6\' 1"  (1.854 m)     Head Circumference --      Peak Flow --      Pain Score 05/25/23 0945 7     Pain Loc --      Pain Edu? --      Excl. in GC? --    No data found.  Updated Vital Signs BP (!) 133/94 (BP Location: Right Arm)   Pulse 71   Temp 98 F (36.7 C) (Oral)   Resp 17   Ht 6\' 1"  (1.854 m)   Wt 210 lb (95.3 kg)   SpO2 97%   BMI 27.71 kg/m   Visual Acuity Right Eye Distance:   Left Eye Distance:   Bilateral Distance:    Right Eye Near:   Left Eye Near:    Bilateral Near:     Physical Exam Vitals and nursing note reviewed.  Constitutional:      Appearance:  Normal appearance.  HENT:     Head: Normocephalic and atraumatic.     Right Ear: External ear normal.     Left Ear: External ear normal.     Nose: Nose normal.     Mouth/Throat:     Lips: Pink.     Mouth: Mucous membranes are moist.     Dentition: Abnormal dentition. Dental tenderness and dental caries present. No dental abscesses.     Tongue: No lesions.     Pharynx: Uvula midline.     Tonsils: No tonsillar exudate or tonsillar abscesses.      Comments: Multiple dental caries and dental erosions.  Right posterior molar with extensive erosion and tenderness.  No obvious palpable masses or purulent discharge. Eyes:     General: No scleral icterus. Cardiovascular:     Rate and Rhythm: Normal rate.  Pulmonary:     Effort: Pulmonary effort is normal. No respiratory distress.  Musculoskeletal:     Cervical back: Normal range of motion.  Skin:    General: Skin is warm and dry.  Neurological:     General: No focal deficit present.     Mental Status: He is alert.  Psychiatric:        Mood and Affect: Mood normal.        Behavior: Behavior is cooperative.      UC Treatments / Results  Labs (all labs ordered are listed, but only abnormal results are displayed) Labs Reviewed - No data to display  EKG   Radiology No results found.  Procedures Procedures (including critical care time)  Medications Ordered in UC Medications  acetaminophen (TYLENOL) tablet 650 mg (has no administration in time range)    Initial Impression / Assessment and Plan / UC Course  I have reviewed the triage vital signs and the nursing notes.  Pertinent labs & imaging results that were available during my care of the patient were reviewed by me and considered in my medical decision making (see chart for details).  Vitals and triage reviewed, patient is hemodynamically stable.  Dental caries and erosions, intermittent and ongoing.  Right lower posterior molar with extensive erosion and tenderness.   No palpable masses.  Patient reports allergic reaction to penicillin, unsure reaction.  Will cover for oral infection with clindamycin.  Encouraged to take ibuprofen  appropriately to avoid kidney damage.  Given dental resources.  Return and emergency precautions given, patient verbalized understanding.  No questions at this time.  Work note provided.     Final Clinical Impressions(s) / UC Diagnoses   Final diagnoses:  Pain due to dental caries     Discharge Instructions      You have extensive dental erosion causing your dental pain.  I am covering you with antibiotics for potential infection, please take them as prescribed and until finished.  You can take them with food to prevent gastrointestinal upset.  It is important not to take too much ibuprofen or Tylenol, as this can damage your kidneys or liver.  For pain and swelling take 800 mg of ibuprofen up to every 8 hours, you can alternate with 500 mg of Tylenol as needed, alternating the two every 4-6 hours.   It is important that you receive further evaluation with a dentist, as these teeth may need to be removed.  Below are some dental resources.  Please return to clinic or seek follow-up care if you develop fever, oral swelling, return of dental pain despite antibiotics.  [facility] [place] Address: [address] [place], [place], [place] [address] Phone: [phone]  [facility] [phone] extension [phone] [address]  Dr. Lawrence Marseilles [phone] [address]  [place] [phone] 2[address] [facility] [place].  [facility] [phone] extension [phone], [place], [place], [address] First come first serve for the first 10 clients.  May do simple extractions only, no wisdom teeth or surgery.  You may try the second for Thursday of the month starting at 6:30 AM.  [facility] You may call the school to see if they are still helping to provide  dental care for emergent cases.      ED Prescriptions     Medication Sig Dispense Auth. Provider   clindamycin (CLEOCIN) 150 MG capsule Take 1 capsule (150 mg total) by mouth every 6 (six) hours for 7 days. 28 capsule Rinaldo Ratel, [place], [place]   acetaminophen (TYLENOL) 500 MG tablet Take 1 tablet (500 mg total) by mouth every 6 (six) hours as needed. 30 tablet Cleofas Hudgins, [place], [place]      PDMP not reviewed this encounter.   Brandonlee Navis, [place] N, [place] 05/25/23 1021

## 2023-05-25 NOTE — ED Triage Notes (Signed)
Patient here today with c/o right side dental pain since yesterday. He describes the pain as throbbing. Patient took IBU last night with some relief.

## 2024-10-21 ENCOUNTER — Ambulatory Visit: Payer: Self-pay

## 2024-10-21 NOTE — Telephone Encounter (Signed)
 FYI Only or Action Required?: FYI only for provider: Advised [facility].  Patient was last seen in primary care on unknown.  Called Seth Kim reporting Anxiety.  Symptoms began several years ago.  Interventions attempted: OTC medications: Nyquil.  Symptoms are: gradually worsening.  Kim Disposition: See Physician Within 24 Hours  Patient/caregiver understands and will follow disposition?: Yes        Copied from CRM #[id number]. Topic: Clinical - Red Word Kim >> Oct 21, 2024  3:30 PM Seth Kim wrote: Red Word that prompted transfer to Seth Kim: manic episodes Reason for Disposition  Patient sounds very upset or troubled to the triager  Answer Assessment - Initial Assessment Questions 1. CONCERN: Did anything happen that prompted you to call today?      Wants to get back on medications  2. ANXIETY SYMPTOMS: Can you describe how you (your loved one; patient) have been feeling? (e.g., tense, restless, panicky, anxious, keyed up, overwhelmed, sense of impending doom).      Panicky  3. ONSET: How long have you been feeling this way? (e.g., hours, days, weeks)     Years  4. SEVERITY: How would you rate the level of anxiety? (e.g., 0 - 10; or mild, moderate, severe).     Moderate and is severe at night  5. FUNCTIONAL IMPAIRMENT: How have these feelings affected your ability to do daily activities? Have you had more difficulty than usual doing your normal daily activities? (e.g., getting better, same, worse; self-care, school, work, interactions)     Yes, has had to call out of work due to symptoms.  6. HISTORY: Have you felt this way before? Have you ever been diagnosed with an anxiety problem in the past? (e.g., generalized anxiety disorder, panic attacks, PTSD). If Yes, ask: How was this problem treated? (e.g., medicines, counseling, etc.)     Yes  7. RISK OF HARM - SUICIDAL IDEATION: Do you ever have thoughts of hurting or killing yourself? If Yes, ask:  Do  you have these feelings now? Do you have a plan on how you would do this?     He states he sometimes feels like someone is going to kill him  8. TREATMENT:  What has been done so far to treat this anxiety? (e.g., medicines, relaxation strategies). What has helped?     Medications ans smoking majujana  9. THERAPIST: Do you have a counselor or therapist? If Yes, ask: What is their name?     No  10. PATIENT SUPPORT: Who is with you now? Who do you live with? Do you have family or friends who you can talk to?        He has his sister  48. OTHER SYMPTOMS: Do you have any other symptoms? (e.g., feeling depressed, trouble concentrating, trouble sleeping, trouble breathing, palpitations or fast heartbeat, chest pain, sweating, nausea, or diarrhea)       Trouble sleeping  Pt states he has not been on his psych meds since 2019 . Hx of bipolar and schizophrenia. He states he is currently taking Nyquil at night for symptoms. Pt given behavioral health resources and states he will walk into Seth Kim today for symptoms. Pt also made a new pt appt to establish care with a PCP.  Protocols used: Anxiety and Panic Attack-A-AH

## 2024-10-23 ENCOUNTER — Ambulatory Visit (INDEPENDENT_AMBULATORY_CARE_PROVIDER_SITE_OTHER): Admitting: Clinical

## 2024-10-23 DIAGNOSIS — F200 Paranoid schizophrenia: Secondary | ICD-10-CM | POA: Diagnosis not present

## 2024-10-25 NOTE — Progress Notes (Signed)
 Comprehensive Clinical Assessment (CCA) Note  10/23/2024 Seth Kim [id number]  Virtual Visit via Video Note  I connected with Seth Kim on 10/23/2024 at 10:00 AM EST by a video enabled telemedicine application and verified that I am speaking with the correct person using two identifiers.  Location: Patient: home Provider: [email or web address]   I discussed the limitations of evaluation and management by telemedicine and the availability of in person appointments. The patient expressed understanding and agreed to proceed.   Follow Up Instructions: I discussed the assessment and treatment plan with the patient. The patient was provided an opportunity to ask questions and all were answered. The patient agreed with the plan and demonstrated an understanding of the instructions.   The patient was advised to call back or seek an in-person evaluation if the symptoms worsen or if the condition fails to improve as anticipated.  I provided 25 minutes of non-face-to-face time during this encounter.   Seth Kim Y Seth Oak, LCSW   Chief Complaint:  Chief Complaint  Patient presents with   Hallucinations   Anxiety   Depression   Schizophrenia   Visit Diagnosis:   Paranoid schizophrenia   Treatment Recommendations:  medication management via [facility] psychiatry, appt made for Nov 26.    CCA Biopsychosocial Intake/Chief Complaint:  client reported he is presenting for servces by his own referral. client reported he has been off of his medication since COVID-19 shut down. client reported he was diagnosed in 2016 with bipolar disorder and paranoid schizophrenia. client reported he was being treated at [facility] behavioral services for a period of time. Client reported he last experienced a manic episode last friday. client reported he has also having auditory hallucinations, hearing multiple voices. client reported at night it is worse. client reported in the past week he has called the police several  time because he was paranoid. client reported he can feel his episodes coming on like a headache.  Current Symptoms/Problems: client reported when he is depressed he feels very low and expericnes crying spells. client reported insomnia. client reported he has been taking ZZZquil to help him fall asleep nightly. client reported auditory hallucinations are just chatter and not command. client reported the voices can cause him to have panic symptoms.  Patient Reported Schizophrenia/Schizoaffective Diagnosis in Past: Yes  Strengths: volunteering to engage in services  Preferences: psychiatry  Abilities: engage in treatment planning  Type of Services Patient Feels are Needed: medication management  Initial Clinical Notes/Concerns: client reported last friday was his first episode in awhile that he can recall. client reported he has noticed leading up to the episode he was experiencing mood swings for a week or more but didnt think anything of it until friday when he was paranoid and experiencing AVH. client reported on his father side of the family there is a history of mental illness.   Mental Health Symptoms Depression:  Tearfulness   Duration of Depressive symptoms: Greater than two weeks   Mania:  Change in energy/activity   Anxiety:   Tension; Difficulty concentrating; Sleep   Psychosis:  None   Duration of Psychotic symptoms: No data recorded  Trauma:  None   Obsessions:  None   Compulsions:  None   Inattention:  None   Hyperactivity/Impulsivity:  None   Oppositional/Defiant Behaviors:  None   Emotional Irregularity:  None   Other Mood/Personality Symptoms:  No data recorded   Mental Status Exam Appearance and self-care  Stature:  Tall   Weight:  Average weight  Clothing:  Casual   Grooming:  Normal   Cosmetic use:  Age appropriate   Posture/gait:  Normal   Motor activity:  Not Remarkable   Sensorium  Attention:  Normal   Concentration:  Normal    Orientation:  X5   Recall/memory:  Normal   Affect and Mood  Affect:  Congruent   Mood:  Euthymic   Relating  Eye contact:  Normal   Facial expression:  Responsive   Attitude toward examiner:  Cooperative   Thought and Language  Speech flow: Clear and Coherent   Thought content:  Appropriate to Mood and Circumstances   Preoccupation:  None   Hallucinations:  Auditory   Organization:  No data recorded  [employer] of Knowledge:  Good   Intelligence:  Average   Abstraction:  Normal   Judgement:  Good   Reality Testing:  Adequate   Insight:  Good   Decision Making:  Normal   Social Functioning  Social Maturity:  Responsible   Social Judgement:  Normal   Stress  Stressors:  Illness   Coping Ability:  Resilient   Skill Deficits:  Activities of daily living   Supports:  Family (client reported he lives wit his younger sister.)     Religion: Religion/Spirituality Are You A Religious Person?: No  Leisure/Recreation: Leisure / Recreation Do You Have Hobbies?: No  Exercise/Diet: Exercise/Diet Do You Exercise?: No Have You Gained or Lost A Significant Amount of Weight in the Past Six Months?: No Do You Follow a Special Diet?: No Do You Have Any Trouble Sleeping?: Yes Explanation of Sleeping Difficulties: client reported he takes OTC ZZZquil to go to sleep.   CCA Employment/Education Employment/Work Situation: Employment / Work Situation Employment Situation: Employed Where is Patient Currently Employed?: [employer] Long has Patient Been Employed?: 2 years Are You Satisfied With Your Job?: Yes Do You Work More Than One Job?: No  Education: Education Did Garment/textile Technologist From [employer]?: Yes (GED- 2021) Did You Attend College?: Yes What Type of College Degree Do you Have?: client reported he completed some college courses   CCA Family/Childhood History Family and Relationship History: Family history Marital  status: Single Does patient have children?: No  Childhood History:  Childhood History Additional childhood history information: client reported he is from [place]. client reported he was primarly raised by his mother. Patient's description of current relationship with people who raised him/her: client reported his mother passed way 2 years ago. client reported his father passed away 4 years ago. Does patient have siblings?: Yes Number of Siblings: 2 Description of patient's current relationship with siblings: client reported he has a sister he lives with. client reported he has a younger sister who lives in [place]. Did patient suffer any verbal/emotional/physical/sexual abuse as a child?: No Did patient suffer from severe childhood neglect?: No Has patient ever been sexually abused/assaulted/raped as an adolescent or adult?: No Was the patient ever a victim of a crime or a disaster?: No Witnessed domestic violence?: No Has patient been affected by domestic violence as an adult?: No  Child/Adolescent Assessment:     CCA Substance Use Alcohol/Drug Use: Alcohol / Drug Use History of alcohol / drug use?: No history of alcohol / drug abuse                         ASAM's:  Six Dimensions of Multidimensional Assessment  Dimension 1:  Acute Intoxication and/or Withdrawal Potential:  Dimension 2:  Biomedical Conditions and Complications:      Dimension 3:  Emotional, Behavioral, or Cognitive Conditions and Complications:     Dimension 4:  Readiness to Change:     Dimension 5:  Relapse, Continued use, or Continued Problem Potential:     Dimension 6:  Recovery/Living Environment:     ASAM Severity Score:    ASAM Recommended Level of Treatment:     Substance use Disorder (SUD)    Recommendations for Services/Supports/Treatments: Recommendations for Services/Supports/Treatments Recommendations For Services/Supports/Treatments: Medication Management  DSM5 Diagnoses: Patient  Active Problem List   Diagnosis Date Noted   Status post left hip replacement 05/19/2022   Avascular necrosis of bone of left hip (HCC) 03/15/2022   Avascular necrosis of hip, right (HCC) 03/15/2022   Elevated transaminase level    AKI (acute kidney injury) 01/24/2015   Lactic acidosis 01/24/2015   Leukocytosis 01/24/2015   Abnormal transaminases 01/24/2015   Altered mental status 01/23/2015   Paranoid delusion (HCC) 01/23/2015    Patient Centered Plan: Patient is on the following Treatment Plan(s):  Anxiety   Referrals to Alternative Service(s): Referred to Alternative Service(s):   Place:   Date:   Time:    Referred to Alternative Service(s):   Place:   Date:   Time:    Referred to Alternative Service(s):   Place:   Date:   Time:    Referred to Alternative Service(s):   Place:   Date:   Time:      Collaboration of Care: Medication Management [facility]  Patient/Guardian was advised Release of Information must be obtained prior to any record release in order to collaborate their care with an outside provider. Patient/Guardian was advised if they have not already done so to contact the registration department to sign all necessary forms in order for us  to release information regarding their care.   Consent: Patient/Guardian gives verbal consent for treatment and assignment of benefits for services provided during this visit. Patient/Guardian expressed understanding and agreed to proceed.   Seth Kendra Y Durrel Mcnee, LCSW

## 2024-10-29 ENCOUNTER — Encounter (HOSPITAL_COMMUNITY): Payer: Self-pay | Admitting: Physician Assistant

## 2024-10-29 ENCOUNTER — Ambulatory Visit (INDEPENDENT_AMBULATORY_CARE_PROVIDER_SITE_OTHER): Admitting: Physician Assistant

## 2024-10-29 VITALS — BP 147/116 | HR 90 | Ht 74.0 in | Wt 195.6 lb

## 2024-10-29 DIAGNOSIS — F250 Schizoaffective disorder, bipolar type: Secondary | ICD-10-CM | POA: Diagnosis not present

## 2024-10-29 DIAGNOSIS — F411 Generalized anxiety disorder: Secondary | ICD-10-CM

## 2024-10-29 MED ORDER — ARIPIPRAZOLE 5 MG PO TABS: ORAL_TABLET | ORAL | 0 refills | Status: DC

## 2024-10-29 MED ORDER — ARIPIPRAZOLE 15 MG PO TABS: 15.0000 mg | ORAL_TABLET | Freq: Every day | ORAL | 1 refills | Status: DC

## 2024-10-29 MED ORDER — HYDROXYZINE HCL 10 MG PO TABS: 10.0000 mg | ORAL_TABLET | Freq: Three times a day (TID) | ORAL | 1 refills | Status: DC | PRN

## 2024-10-29 MED ORDER — TRAZODONE HCL 50 MG PO TABS: 50.0000 mg | ORAL_TABLET | Freq: Every day | ORAL | 1 refills | Status: DC

## 2024-10-29 NOTE — Progress Notes (Signed)
 Psychiatric Initial Adult Assessment   Patient Identification: Seth Kim MRN:  [id number] Date of Evaluation:  10/29/2024 Referral Source: Not applicable Chief Complaint:   Chief Complaint  Patient presents with   Establish Care   Medication Management   Visit Diagnosis:    ICD-10-CM   1. Anxiety state  F41.1 hydrOXYzine  (ATARAX ) 10 MG tablet    2. Schizoaffective disorder, bipolar type (HCC)  F25.0 ARIPiprazole  (ABILIFY ) 5 MG tablet    ARIPiprazole  (ABILIFY ) 15 MG tablet    traZODone  (DESYREL ) 50 MG tablet      History of Present Illness:    Seth Kim is a 43 year old male with a past psychiatric history significant for schizoaffective disorder (bipolar type) who presents to [facility] to establish psychiatric care and for medication management.  Patient presents to the encounter stating that he would like to get back on his medications.  Patient reports that he has a history of being diagnosed with schizophrenia and bipolar disorder.  Patient states that he was previously seen at [facility] prior to the COVID pandemic (2019/2020).  He reports that he was diagnosed with schizophrenia and bipolar disorder in February 2016.  Patient reports that when he would go to [facility] for his psychiatric care, he would not be truthful about the symptoms he was experiencing at the time.  Patient reports that he has been on risperidone  and lithium in the past but states that the medications were not helpful in managing his symptoms.  Patient reports that he recently had a manic episode that started last Friday.  During his manic episode, patient reports that he called the cops on himself on 3 different occasions.  During his manic episode, patient stated that he felt like if he did not walk the right way, then the world would blow up.  He also states that he was hearing voices that were telling him that they were the cops and that they  would shoot him through the door. Patient also endorsed panic attacks characterized by rapid heartbeat.  Patient reports that there were no drug used during his last manic episode and states that he would wake up hearing voices.  Patient endorses the following manic symptoms during his last manic episode: increased energy, irritability, difficulty sleeping (due to the voices), financial extravagance, hallucinations, racing thoughts, mood swings, and grandiosity (patient felt like the smartest man in the world and could read people's thoughts).  Patient reports that his manic episode lasted from last Friday until Wednesday of this week.  Patient denies experiencing any depression; however, he reports that he has been ruminating over deceased loved ones.  He reports that this constant rumination may disrupt his mood or cause crying spells.  Patient does endorse anxiety especially when around people he does not know.  Patient rates his anxiety a 6 out of 10 and states that his anxiety is accompanied by nervousness and muscle tension.  Triggers to his anxiety include being around people he does not know.  Patient endorses general life stressors.  Patient endorses a past history of hospitalization stating that he was last hospitalized in 2016.  Patient denies a past history of suicide attempt.  A PHQ-9 screen was performed with the patient scoring a 15.  A GAD-7 screen was also performed the patient scoring an 18.  Patient is alert and oriented x 4, calm, cooperative, and fully engaged in conversation during the encounter.  Patient endorses good mood.  Patient exhibits depressed  and anxious mood with congruent affect.  Patient denies suicidal or homicidal ideations.  He further denies auditory or visual hallucinations and does not appear to be responding to internal/external stimuli.  He denies paranoia or delusional thoughts at this time.  Patient endorses fair sleep and receives on average 6 to 7 hours of sleep  per night.  Patient states that he also has to use NyQuil for sleep.  Patient endorses decreased appetite and eats on average 1-1/2 meals per day.  Patient endorses alcohol consumption stating that he has a beer a day.  Patient endorses tobacco use and smokes on average 3 cigarettes/day.  Patient endorses illicit drug use in the form of marijuana.  Associated Signs/Symptoms: Depression Symptoms:  depressed mood, anhedonia, insomnia, hypersomnia, psychomotor agitation, psychomotor retardation, feelings of worthlessness/guilt, difficulty concentrating, hopelessness, impaired memory, recurrent thoughts of death, anxiety, panic attacks, disturbed sleep, weight loss, decreased appetite, (Hypo) Manic Symptoms:  Delusions, Distractibility, Elevated Mood, Flight of Ideas, Licensed Conveyancer, Grandiosity, Hallucinations, Impulsivity, Irritable Mood, Labiality of Mood, Sexually Inapproprite Behavior, Anxiety Symptoms:  Agoraphobia, Excessive Worry, Panic Symptoms, Obsessive Compulsive Symptoms:   Patient reports that he likes for things to be in order, Social Anxiety, Psychotic Symptoms:  Delusions, Hallucinations: Auditory Paranoia, PTSD Symptoms: Had a traumatic exposure:  Patient reports that he was deeply impacted by deaths of people closest to him. Had a traumatic exposure in the last month:  N/A Re-experiencing:  Flashbacks Intrusive Thoughts Nightmares Hypervigilance:  Yes Hyperarousal:  Difficulty Concentrating Emotional Numbness/Detachment Increased Startle Response Irritability/Anger Sleep Avoidance:  Decreased Interest/Participation  Past Psychiatric History:  Patient endorses a past psychiatric history significant for schizophrenia/bipolar disorder.  Patient believes that he was diagnosed in 2016.  - Patient previously being seen at [facility]  Patient endorses a past history of hospitalization due to mental health.  He reports that he was  hospitalized in 2016. - Prior to his hospitalization, patient presented to the ED with a chief complaint of altered mental status.  Prior to presenting to the ED, patient was reportedly found in the neighbors home, altered.  Patient was approached by police, but appeared agitated, combative which required physical restraints by the police.  During his EMS ride, patient reportedly was expressing delusions and paranoia.  Patient was cocaine and THC positive.  Patient denies a past history of suicide attempt.  Patient denies a past history of homicide attempts.  Previous Psychotropic Medications: Yes , patient reports that he has been on risperidone  and lithium in the past.  Substance Abuse History in the last 12 months:  Yes.    Patient reports that he does use marijuana.  Consequences of Substance Abuse: Patient denies a past history of illicit drug use.  Patient admits to using marijuana.  Several years ago while patient was assessed in the ED, patient was found to be positive for [facility] and cocaine.  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   Procedure: LEFT TOTAL HIP ARTHROPLASTY ANTERIOR APPROACH;  Surgeon: Vernetta Lonni GRADE, MD;  Location: [facility]S;  Service: Orthopedics;  Laterality: Left;    Family  Psychiatric History:  Father - possible mental health issues, but never diagnosed or documented Grandfather (paternal) - possible Alzheimer's  Family history of suicide attempt: Patient denies Family history of homicide attempt: Patient denies Family history of substance abuse:  Patient denies  Family History:  Family History  Problem Relation Age of Onset   Asthma Father     Social History:   Social History   Socioeconomic History   Marital status: Single    Spouse name: Not on file   Number of children: Not on file   Years of education: Not on file   Highest education level: Not on file  Occupational History   Not on file  Tobacco Use   Smoking status: Some Days   Smokeless tobacco: Never  Vaping Use   Vaping status: Never Used  Substance and Sexual Activity   Alcohol use: Yes    Comment: not past drink, usually few beers a day   Drug use: Not Currently    Types: Cocaine, Marijuana    Comment: Per labs    Sexual activity: Not on file  Other Topics Concern   Not on file  Social History Narrative   Not on file   Social Drivers of Health   Financial Resource Strain: Not on file  Food Insecurity: Not on file  Transportation Needs: Not on file  Physical Activity: Not on file  Stress: Not on file  Social Connections: Not on file    Additional Social History:  Patient endorses social support.  Patient denies having children in the room.  Patient endorses housing.  Patient is currently unemployed.  Patient denies a past history of military experience.  Patient denies a past history of prison or jail time.  Highest education earned by the patient is his GED/some college.  Patient denies access to weapons.  Allergies:   Allergies  Allergen Reactions   Penicillins Other (See Comments)    unknown    Metabolic Disorder Labs: No results found for: HGBA1C, MPG No results found for: PROLACTIN No results found for: CHOL, TRIG, HDL, CHOLHDL, VLDL, LDLCALC Lab Results  Component Value Date   TSH 2.981 01/22/2015    Therapeutic Level Labs: No results found for: LITHIUM No results found for: CBMZ No results found for: VALPROATE  Current Medications: Current Outpatient Medications  Medication Sig Dispense Refill    ARIPiprazole  (ABILIFY ) 15 MG tablet Take 1 tablet (15 mg total) by mouth daily. 30 tablet 1   ARIPiprazole  (ABILIFY ) 5 MG tablet Take 1 tablet (5 mg total) by mouth daily for 6 days, THEN 2 tablets (10 mg total) daily for 6 days. 18 tablet 0   hydrOXYzine  (ATARAX ) 10 MG tablet Take 1 tablet (10 mg total) by mouth 3 (three) times daily as needed. 75 tablet 1   traZODone  (DESYREL ) 50 MG tablet Take 1-2 tablets (50-100 mg total) by mouth at bedtime. 60 tablet 1   acetaminophen  (TYLENOL ) 500 MG tablet Take 1 tablet (500 mg total) by mouth every 6 (six) hours as needed. 30 tablet 0   No current facility-administered medications for this visit.    Musculoskeletal: Strength & Muscle Tone: within normal limits Gait & Station: normal Patient leans: N/A  Psychiatric Specialty Exam: Review of Systems  Psychiatric/Behavioral:  Positive for dysphoric mood, hallucinations and sleep disturbance. Negative for decreased concentration, self-injury and suicidal ideas. The patient is nervous/anxious. The patient is not hyperactive.     Blood pressure (!) 147/116, pulse 90, height 6' 2 (1.88 m), weight  195 lb 9.6 oz (88.7 kg), SpO2 94%.Body mass index is 25.11 kg/m.  General Appearance: Casual  Eye Contact:  Good  Speech:  Clear and Coherent and Normal Rate  Volume:  Normal  Mood:  Anxious and Depressed  Affect:  Congruent  Thought Process:  Coherent, Goal Directed, and Descriptions of Associations: Intact  Orientation:  Full (Time, Place, and Person)  Thought Content:  Delusions and Hallucinations: Auditory  Suicidal Thoughts:  No  Homicidal Thoughts:  No  Memory:  Immediate;   Good Recent;   Fair Remote;   Fair  Judgement:  Good  Insight:  Good  Psychomotor Activity:  Normal  Concentration:  Concentration: Good and Attention Span: Good  Recall:  Fair  Fund of Knowledge:Good  Language: Good  Akathisia:  No  Handed: Not assessed  AIMS (if indicated):  not done  Assets:  Communication  Skills Desire for Improvement Housing Social Support Vocational/Educational  ADL's:  Intact  Cognition: WNL  Sleep:  Fair   Screenings: GAD-7    Flowsheet Row [facility]  Total GAD-7 Score 18   PHQ2-9    Flowsheet Row Office Visit from 10/29/2024 in [place]  PHQ-2 Total Score 4  PHQ-9 Total Score 15   Flowsheet Row Office Visit from 10/29/2024 in [facility] from 05/25/2023 in [facility] Admission (Discharged) from 05/19/2022 in [place] [facility]  C-SSRS RISK CATEGORY Low Risk No Risk No Risk    Assessment and Plan:   Yon Schiffman is a 43 year old male with a past psychiatric history significant for schizoaffective disorder (bipolar type) who presents to [facility] to establish psychiatric care and for medication management.  Patient endorses a past psychiatric history significant for schizophrenia and bipolar disorder.  Patient reports that he was diagnosed with his mental illness in 2016.  Patient has a history of being seen at [facility] and was last seen at that facility prior to the COVID pandemic.  Patient has a history of being on lithium and risperidone  for the management of his symptoms but states that the medications were ineffective.  Patient presents to the encounter stating that he recently experienced a manic episode that lasted from last Friday to Wednesday of this week.  Patient's manic episode was characterized by the following symptoms:  increased energy, irritability, difficulty sleeping (due to the voices), financial extravagance, hallucinations, racing thoughts, mood swings, and grandiosity (patient felt like the smartest man in the world and could read people's thoughts).  Patient denies overt depressive symptoms at this time but states that  when he ruminates about loved ones that have passed, he may experience disruptions in his mood or crying spells.  Patient does endorse anxiety stating that he gets anxious when around people he does not know.  A PHQ-9 screen was performed the patient scoring a 15.  A GAD-7 screen was also performed with the patient scoring an 18.  Patient is not on any psychiatric medications at this time.  Provider recommended patient be placed on Abilify  5 mg for 6 days, followed by 10 mg for 6 more days, followed by 50 mg daily for mood stability.  Provider also recommended patient be placed on hydroxyzine  10 mg 3 times daily as needed for the management of his anxiety.  Lastly, provider recommended patient be placed on trazodone  50 to 100 mg at bedtime as  needed for the management of his sleep.  Patient was agreeable to recommendations.  Patient's medications to be e-prescribed to pharmacy of choice.  Prior to the conclusion of the encounter, provider discussed the side effect profile associated with patient's current medication regimen.  Patient verbalized understanding.  A [place] Suicide Severity Rating Scale was performed with the patient being considered low risk.  Patient denies suicidal ideations and is able to contract for safety at this time.    Collaboration of Care: Medication Management AEB provider managing patient's psychiatric medications, Primary Care Provider AEB patient being seen by family medicine, and Psychiatrist AEB patient being followed by a mental health provider at this facility  Patient/Guardian was advised Release of Information must be obtained prior to any record release in order to collaborate their care with an outside provider. Patient/Guardian was advised if they have not already done so to contact the registration department to sign all necessary forms in order for us  to release information regarding their care.   Consent: Patient/Guardian gives verbal consent for treatment and  assignment of benefits for services provided during this visit. Patient/Guardian expressed understanding and agreed to proceed.   1. Anxiety state (Primary)  - hydrOXYzine  (ATARAX ) 10 MG tablet; Take 1 tablet (10 mg total) by mouth 3 (three) times daily as needed.  Dispense: 75 tablet; Refill: 1  2. Schizoaffective disorder, bipolar type (HCC)  - ARIPiprazole  (ABILIFY ) 5 MG tablet; Take 1 tablet (5 mg total) by mouth daily for 6 days, THEN 2 tablets (10 mg total) daily for 6 days.  Dispense: 18 tablet; Refill: 0 - ARIPiprazole  (ABILIFY ) 15 MG tablet; Take 1 tablet (15 mg total) by mouth daily.  Dispense: 30 tablet; Refill: 1 - traZODone  (DESYREL ) 50 MG tablet; Take 1-2 tablets (50-100 mg total) by mouth at bedtime.  Dispense: 60 tablet; Refill: 1  Patient to follow-up in 6 weeks Provider spent a total of 48 minutes with the patient/reviewing patient's chart  Reginia FORBES Bolster, PA 11/26/20257:50 PM

## 2024-10-31 ENCOUNTER — Other Ambulatory Visit: Payer: Self-pay | Admitting: Medical Genetics

## 2024-11-06 ENCOUNTER — Ambulatory Visit: Admitting: Medical

## 2024-12-09 ENCOUNTER — Ambulatory Visit: Admitting: Medical

## 2024-12-17 ENCOUNTER — Ambulatory Visit: Admitting: Medical

## 2024-12-24 ENCOUNTER — Telehealth (HOSPITAL_COMMUNITY): Admitting: Physician Assistant

## 2024-12-24 ENCOUNTER — Encounter (HOSPITAL_COMMUNITY): Payer: Self-pay | Admitting: Physician Assistant

## 2024-12-24 DIAGNOSIS — F250 Schizoaffective disorder, bipolar type: Secondary | ICD-10-CM

## 2024-12-24 DIAGNOSIS — F411 Generalized anxiety disorder: Secondary | ICD-10-CM | POA: Diagnosis not present

## 2024-12-24 DIAGNOSIS — Z5181 Encounter for therapeutic drug level monitoring: Secondary | ICD-10-CM | POA: Diagnosis not present

## 2024-12-24 MED ORDER — RISPERIDONE 2 MG PO TABS: 2.0000 mg | ORAL_TABLET | Freq: Every day | ORAL | 1 refills | Status: AC

## 2024-12-24 MED ORDER — RISPERIDONE 1 MG PO TABS: ORAL_TABLET | ORAL | 0 refills | Status: AC

## 2024-12-24 MED ORDER — TRAZODONE HCL 50 MG PO TABS: 50.0000 mg | ORAL_TABLET | Freq: Every day | ORAL | 1 refills | Status: AC

## 2024-12-24 MED ORDER — HYDROXYZINE HCL 10 MG PO TABS: 10.0000 mg | ORAL_TABLET | Freq: Three times a day (TID) | ORAL | 1 refills | Status: AC | PRN

## 2024-12-24 NOTE — Progress Notes (Cosign Needed)
 [facility] MD/PA/NP OP Progress Note  Virtual Visit via Video Note  I connected with Seth Kim on 12/24/24 at  5:00 PM EST by a video enabled telemedicine application and verified that I am speaking with the correct person using two identifiers.  Location: Patient: Home Provider: Clinic   I discussed the limitations of evaluation and management by telemedicine and the availability of in person appointments. The patient expressed understanding and agreed to proceed.  Follow Up Instructions:   I discussed the assessment and treatment plan with the patient. The patient was provided an opportunity to ask questions and all were answered. The patient agreed with the plan and demonstrated an understanding of the instructions.   The patient was advised to call back or seek an in-person evaluation if the symptoms worsen or if the condition fails to improve as anticipated.  I provided 21 minutes of non-face-to-face time during this encounter.  Reginia FORBES Bolster, PA    12/24/2024 8:32 PM KELON EASOM  MRN:  [id number]  Chief Complaint:  Chief Complaint  Patient presents with   Follow-up   Medication Management   HPI:   Seth Kim is a 44 year old male with a past psychiatric history significant for anxiety and schizoaffective disorder (bipolar type) who presents to [facility] via virtual video visit for follow-up and medication management.  Patient is currently being managed on the following psychiatric medications:  Hydroxyzine  10 mg 3 times daily as needed Abilify  15 mg daily Trazodone  50 to 100 mg at bedtime  Patient presents to the encounter stating that his use of Abilify  made him feel antsy and he could not sit still.  He reports that his Abilify  was last taken roughly 2 weeks ago.  Though patient was unable to tolerate his Abilify  use, he reports that the medication was somewhat effective in managing his mood.  Patient reports that he  still continues to have issues with his attitude and states that he will often get angry real quickly.  To avoid getting angry, patient reports that he often counts down from 10.  He reports that his use of trazodone  has been helpful in managing his sleep.  He continues to endorse depression characterized by ruminating, worthlessness, and sadness.  Patient reports that he will ruminate about sad things such as his childhood or his mother.  Patient rates his depression a 10 out of 10 with 10 being most severe.  He reports that his depressive episodes can occur at any time during the week.  Patient endorses the following depressive symptoms: constant ruminating, worthlessness, feelings of sadness, crying spells, lack of motivation, and decreased energy.  In addition to his depression, patient endorses anxiety triggered by his paranoia.  Patient reports that he is often paranoid about whether or not he is doing the right thing or saying the right thing.  Patient rates his anxiety a 7 out of 10.  He reports that his anxiety is also triggered by large crowds.  In addition to anxiety and paranoia, patient reports that he hears voices all the time.  He reports that the voices he hears are from his mother, brother, father, and friends.  Patient reports that he often prays to help limit the voices.  He reports that the voices are not as loud anymore.  A PHQ-9 screen was performed the patient scoring a 16.  A GAD-7 screen was also performed with the patient scoring an 18.  Patient is alert and oriented x  4, calm, cooperative, and fully engaged in conversation during the encounter.  Patient describes his mood as chill.  Patient exhibits depressed mood with congruent affect.  Patient denies suicidal or homicidal ideations.  He denies active auditory or visual hallucinations and does not appear to be responding to internal/external stimuli.  Patient endorses fair sleep and receives on average 5 hours of sleep per night.   Patient endorses good appetite and eats on average 3 meals per day.  Patient endorses moderate alcohol consumption.  Patient endorses tobacco use and smokes on average 3 cigarettes/day.  Patient endorses illicit drug use in the form of marijuana.  Visit Diagnosis:    ICD-10-CM   1. Anxiety state  F41.1 hydrOXYzine  (ATARAX ) 10 MG tablet    2. Schizoaffective disorder, bipolar type (HCC)  F25.0 risperiDONE  (RISPERDAL ) 1 MG tablet    risperiDONE  (RISPERDAL ) 2 MG tablet    traZODone  (DESYREL ) 50 MG tablet    3. Encounter for therapeutic drug monitoring  Z51.81 Renal Function Panel    Thyroid Panel With TSH    CBC with Differential/Platelet    Calcium    Parathyroid hormone, intact (no Ca)    Urinalysis      Past Psychiatric History:  Patient endorses a past psychiatric history significant for schizophrenia/bipolar disorder.  Patient believes that he was diagnosed in 2016.            - Patient previously being seen at [facility]   Patient endorses a past history of hospitalization due to mental health.  He reports that he was hospitalized in 2016. - Prior to his hospitalization, patient presented to the ED with a chief complaint of altered mental status.  Prior to presenting to the ED, patient was reportedly found in the neighbors home, altered.  Patient was approached by police, but appeared agitated, combative which required physical restraints by the police.  During his EMS ride, patient reportedly was expressing delusions and paranoia.  Patient was cocaine and THC positive.   Patient denies a past history of suicide attempt.   Patient denies a past history of homicide attempts.  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   Procedure: LEFT TOTAL HIP  ARTHROPLASTY ANTERIOR APPROACH;  Surgeon: Vernetta Lonni GRADE, MD;  Location: [facility];  Service: Orthopedics;  Laterality: Left;    Family Psychiatric History:  Father - possible mental health issues, but never diagnosed or documented Grandfather (paternal) - possible Alzheimer's   Family history of suicide attempt: Patient denies Family history of homicide attempt: Patient denies Family history of substance abuse: Patient denies  Family History:  Family History  Problem Relation Age of Onset   Asthma Father     Social History:  Social History   Socioeconomic History   Marital status: Single    Spouse name: Not on file   Number of children: Not on file   Years of education: Not on file   Highest education level: Not on file  Occupational History   Not on file  Tobacco Use   Smoking status: Some Days   Smokeless tobacco: Never  Vaping Use   Vaping status: Never Used  Substance and Sexual Activity   Alcohol use: Yes    Comment: not past drink, usually  few beers a day   Drug use: Not Currently    Types: Cocaine, Marijuana    Comment: Per labs    Sexual activity: Not on file  Other Topics Concern   Not on file  Social History Narrative   Not on file   Social Drivers of Health   Tobacco Use: High Risk (12/24/2024)   Patient History    Smoking Tobacco Use: Some Days    Smokeless Tobacco Use: Never    Passive Exposure: Not on file  Financial Resource Strain: Not on file  Food Insecurity: Not on file  Transportation Needs: Not on file  Physical Activity: Not on file  Stress: Not on file  Social Connections: Not on file  Depression (PHQ2-9): High Risk (12/24/2024)   Depression (PHQ2-9)    PHQ-2 Score: 16  Alcohol Screen: Not on file  Housing: Not on file  Utilities: Not on file  Health Literacy: Not on file    Allergies: Allergies[1]  Metabolic Disorder Labs: No results found for: HGBA1C, MPG No results found for: PROLACTIN No results found for:  CHOL, TRIG, HDL, CHOLHDL, VLDL, LDLCALC Lab Results  Component Value Date   TSH 2.981 01/22/2015    Therapeutic Level Labs: No results found for: LITHIUM No results found for: VALPROATE No results found for: CBMZ  Current Medications: Current Outpatient Medications  Medication Sig Dispense Refill   risperiDONE  (RISPERDAL ) 1 MG tablet Patient to take risperidone  1 mg at bedtime for 6 days then continue taking 2 mg at bedtime 6 tablet 0   risperiDONE  (RISPERDAL ) 2 MG tablet Take 1 tablet (2 mg total) by mouth at bedtime. 30 tablet 1   acetaminophen  (TYLENOL ) 500 MG tablet Take 1 tablet (500 mg total) by mouth every 6 (six) hours as needed. 30 tablet 0   hydrOXYzine  (ATARAX ) 10 MG tablet Take 1 tablet (10 mg total) by mouth 3 (three) times daily as needed. 75 tablet 1   traZODone  (DESYREL ) 50 MG tablet Take 1-2 tablets (50-100 mg total) by mouth at bedtime. 60 tablet 1   No current facility-administered medications for this visit.     Musculoskeletal: Strength & Muscle Tone: within normal limits Gait & Station: normal Patient leans: N/A  Psychiatric Specialty Exam: Review of Systems  Psychiatric/Behavioral:  Positive for dysphoric mood, hallucinations and sleep disturbance. Negative for decreased concentration, self-injury and suicidal ideas. The patient is nervous/anxious. The patient is not hyperactive.     There were no vitals taken for this visit.There is no height or weight on file to calculate BMI.  General Appearance: Casual  Eye Contact:  Good  Speech:  Clear and Coherent and Normal Rate  Volume:  Normal  Mood:  Anxious and Depressed  Affect:  Congruent  Thought Process:  Coherent, Goal Directed, and Descriptions of Associations: Intact  Orientation:  Full (Time, Place, and Person)  Thought Content: Hallucinations: Auditory   Suicidal Thoughts:  No  Homicidal Thoughts:  No  Memory:  Immediate;   Good Recent;   Fair Remote;   Fair  Judgement:   Good  Insight:  Good  Psychomotor Activity:  Normal  Concentration:  Concentration: Good and Attention Span: Good  Recall:  Fair  Fund of Knowledge: Good  Language: Good  Akathisia:  No  Handed: Not assessed  AIMS (if indicated): not done  Assets:  Communication Skills Desire for Improvement Housing Social Support Vocational/Educational  ADL's:  Intact  Cognition: WNL  Sleep:  Fair   Screenings: AIMS    Flowsheet  Row Video Visit from 12/24/2024 in [facility]  AIMS Total Score 0   GAD-7    Flowsheet Row Video Visit from 12/24/2024 in [facility] Office Visit from 10/29/2024 in [facility]  Total GAD-7 Score 18 18   PHQ2-9    Flowsheet Row Video Visit from 12/24/2024 in [facility] Office Visit from 10/29/2024 in [facility]  PHQ-2 Total Score 4 4  PHQ-9 Total Score 16 15   Flowsheet Row Video Visit from 12/24/2024 in [facility] Office Visit from 10/29/2024 in [facility] from 05/25/2023 in [facility] RISK CATEGORY Low Risk Low Risk No Risk     Assessment and Plan:   Seth Kim is a 44 year old male with a past psychiatric history significant for anxiety and schizoaffective disorder (bipolar type) who presents to [facility] via virtual video visit for follow-up and medication management.  Patient is currently being managed on the following psychiatric medications:  Hydroxyzine  10 mg 3 times daily as needed Abilify  15 mg daily Trazodone  50 to 100 mg at bedtime  Patient presents to the encounter stating that his use of Abilify  made him feel antsy and he was unable to sit still.  Provider believes that the patient may have been experiencing akathisia related to his use of Abilify .  Patient has  since discontinued his use of Abilify  and last took the medication roughly 2 weeks ago.  Patient reports that he still continues to have issues with his anger and states that he will get angry real quickly.  He reports that he often has to count down from 10 to keep his anger and check.  Patient also endorses depression characterized by ruminating thoughts, worthlessness, sadness, crying spells, lack of motivation, and decreased energy.  He attributes his depression to ruminating about his childhood and his mother.  Patient also endorses anxiety triggered by large crowds and paranoia.  A PHQ-9 screen was performed the patient scoring a 16.  A GAD-7 screen was also performed with the patient scoring an 18.  Patient also endorses hearing voices all the time.  He reports that his auditory hallucinations can sound like his mother, brothers, father, and friends.  They patient continues to experience auditory hallucinations, he reports that the voices are not as loud as they have been in the past.  Provider recommended patient continue taking his hydroxyzine  and trazodone  as prescribed.  Provider also recommended patient start risperidone  1 mg at bedtime for 6 days, followed by 2 mg at bedtime for mood stability and the management of of his auditory hallucinations.  Patient was agreeable to recommendation.  Patient's medications to be e-prescribed to pharmacy of choice.  Provider discussed side effects associated with the use of risperidone  prior to the conclusion of the encounter.  Patient verbalized understanding.  Patient expresses interest in being placed on lithium since he has taken the medication in the past.  Provider informed patient that in order for him to take lithium, the following labs would need to be obtained prior urinalysis, parathyroid hormone, calcium level, complete blood count with differential, thyroid panel, and renal function panel.  Patient vocalized understanding.  A [place] Suicide  Severity Rating Scale was performed with the patient being considered low risk.  Patient denies suicidal ideations and is able to contract for safety at this time.  Collaboration of Care: Collaboration of Care: Medication Management AEB provider managing patient's psychiatric medications, Primary Care Provider AEB patient being seen by a family medicine provider, and Psychiatrist AEB patient being followed by a mental health provider at this facility  Patient/Guardian was advised Release of Information must be obtained prior to any record release in order to collaborate their care with an outside provider. Patient/Guardian was advised if they have not already done so to contact the registration department to sign all necessary forms in order for us  to release information regarding their care.   Consent: Patient/Guardian gives verbal consent for treatment and assignment of benefits for services provided during this visit. Patient/Guardian expressed understanding and agreed to proceed.   1. Anxiety state (Primary)  - hydrOXYzine  (ATARAX ) 10 MG tablet; Take 1 tablet (10 mg total) by mouth 3 (three) times daily as needed.  Dispense: 75 tablet; Refill: 1  2. Schizoaffective disorder, bipolar type (HCC)  - risperiDONE  (RISPERDAL ) 1 MG tablet; Patient to take risperidone  1 mg at bedtime for 6 days then continue taking 2 mg at bedtime  Dispense: 6 tablet; Refill: 0 - risperiDONE  (RISPERDAL ) 2 MG tablet; Take 1 tablet (2 mg total) by mouth at bedtime.  Dispense: 30 tablet; Refill: 1 - traZODone  (DESYREL ) 50 MG tablet; Take 1-2 tablets (50-100 mg total) by mouth at bedtime.  Dispense: 60 tablet; Refill: 1  3. Encounter for therapeutic drug monitoring  - Renal Function Panel; Future - Thyroid Panel With TSH; Future - CBC with Differential/Platelet; Future - Calcium; Future - Parathyroid hormone, intact (no Ca); Future - Urinalysis; Future  Patient to follow-up in 6 weeks Provider spent a total of  21 minutes with the patient/reviewing patient's chart  Reginia FORBES Bolster, PA 12/24/2024, 8:32 PM     [1]  Allergies Allergen Reactions   Penicillins Other (See Comments)    unknown

## 2025-01-07 ENCOUNTER — Other Ambulatory Visit (HOSPITAL_COMMUNITY)
Admission: RE | Admit: 2025-01-07 | Discharge: 2025-01-07 | Disposition: A | Payer: Self-pay | Source: Ambulatory Visit | Attending: Medical Genetics | Admitting: Medical Genetics
